# Patient Record
Sex: Female | Born: 1965 | Race: White | Hispanic: No | Marital: Married | Smoking: Never smoker
Health system: Southern US, Community
[De-identification: ages and names within clinical notes are randomized; demographics above are authoritative.]

## PROBLEM LIST (undated history)

## (undated) DIAGNOSIS — E785 Hyperlipidemia, unspecified: Secondary | ICD-10-CM

## (undated) DIAGNOSIS — E119 Type 2 diabetes mellitus without complications: Secondary | ICD-10-CM

## (undated) DIAGNOSIS — C4491 Basal cell carcinoma of skin, unspecified: Secondary | ICD-10-CM

## (undated) HISTORY — PX: CHOLECYSTECTOMY: SHX55

## (undated) HISTORY — DX: Hyperlipidemia, unspecified: E78.5

---
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
## 2000-01-18 ENCOUNTER — Other Ambulatory Visit: Admission: RE | Admit: 2000-01-18 | Discharge: 2000-01-18 | Payer: Self-pay | Admitting: Obstetrics & Gynecology

## 2000-03-11 ENCOUNTER — Ambulatory Visit (HOSPITAL_BASED_OUTPATIENT_CLINIC_OR_DEPARTMENT_OTHER): Admission: RE | Admit: 2000-03-11 | Discharge: 2000-03-11 | Payer: Self-pay | Admitting: General Surgery

## 2000-03-11 ENCOUNTER — Encounter (INDEPENDENT_AMBULATORY_CARE_PROVIDER_SITE_OTHER): Payer: Self-pay | Admitting: Specialist

## 2001-11-16 ENCOUNTER — Other Ambulatory Visit: Admission: RE | Admit: 2001-11-16 | Discharge: 2001-11-16 | Payer: Self-pay | Admitting: Obstetrics & Gynecology

## 2003-05-24 ENCOUNTER — Other Ambulatory Visit: Admission: RE | Admit: 2003-05-24 | Discharge: 2003-05-24 | Payer: Self-pay | Admitting: Obstetrics and Gynecology

## 2004-02-21 ENCOUNTER — Inpatient Hospital Stay (HOSPITAL_COMMUNITY): Admission: EM | Admit: 2004-02-21 | Discharge: 2004-02-25 | Payer: Self-pay | Admitting: Emergency Medicine

## 2004-03-07 ENCOUNTER — Ambulatory Visit (HOSPITAL_COMMUNITY): Admission: RE | Admit: 2004-03-07 | Discharge: 2004-03-07 | Payer: Self-pay | Admitting: Internal Medicine

## 2004-05-25 ENCOUNTER — Encounter (INDEPENDENT_AMBULATORY_CARE_PROVIDER_SITE_OTHER): Payer: Self-pay | Admitting: Specialist

## 2004-05-25 ENCOUNTER — Observation Stay (HOSPITAL_COMMUNITY): Admission: RE | Admit: 2004-05-25 | Discharge: 2004-05-26 | Payer: Self-pay | Admitting: General Surgery

## 2005-06-25 ENCOUNTER — Other Ambulatory Visit: Admission: RE | Admit: 2005-06-25 | Discharge: 2005-06-25 | Payer: Self-pay | Admitting: Obstetrics & Gynecology

## 2005-07-16 ENCOUNTER — Ambulatory Visit (HOSPITAL_COMMUNITY): Admission: RE | Admit: 2005-07-16 | Discharge: 2005-07-16 | Payer: Self-pay | Admitting: Family Medicine

## 2005-11-27 ENCOUNTER — Encounter: Admission: RE | Admit: 2005-11-27 | Discharge: 2005-11-27 | Payer: Self-pay | Admitting: Obstetrics & Gynecology

## 2006-04-29 ENCOUNTER — Encounter: Admission: RE | Admit: 2006-04-29 | Discharge: 2006-04-29 | Payer: Self-pay | Admitting: Obstetrics & Gynecology

## 2007-01-21 ENCOUNTER — Encounter: Admission: RE | Admit: 2007-01-21 | Discharge: 2007-01-21 | Payer: Self-pay | Admitting: Obstetrics & Gynecology

## 2007-04-08 ENCOUNTER — Encounter (INDEPENDENT_AMBULATORY_CARE_PROVIDER_SITE_OTHER): Payer: Self-pay | Admitting: Obstetrics & Gynecology

## 2007-04-08 ENCOUNTER — Ambulatory Visit (HOSPITAL_COMMUNITY): Admission: RE | Admit: 2007-04-08 | Discharge: 2007-04-08 | Payer: Self-pay | Admitting: Obstetrics & Gynecology

## 2008-04-19 ENCOUNTER — Encounter: Admission: RE | Admit: 2008-04-19 | Discharge: 2008-04-19 | Payer: Self-pay | Admitting: Obstetrics & Gynecology

## 2009-09-27 ENCOUNTER — Encounter: Admission: RE | Admit: 2009-09-27 | Discharge: 2009-09-27 | Payer: Self-pay | Admitting: Obstetrics & Gynecology

## 2011-03-05 NOTE — Op Note (Signed)
NAME:  Rachel Bishop, Rachel Bishop NO.:  [id number]   MEDICAL RECORD NO.:  [id number]          PATIENT TYPE:  AMB   LOCATION:  SDC                           FACILITY:  [facility]   PHYSICIAN:  Freddy Finner, M.D.   DATE OF BIRTH:  10/02/1966   DATE OF PROCEDURE:  04/08/2007  DATE OF DISCHARGE:                               OPERATIVE REPORT   PREOPERATIVE DIAGNOSIS:  Menorrhagia.  Ultrasound and clinical findings  consistent with adenomyosis.   POSTOPERATIVE DIAGNOSIS:  Menorrhagia.  Ultrasound and clinical findings  consistent with adenomyosis.   OPERATIVE PROCEDURE:  Hysteroscopy, dilatation and curettage, and  Novasure endometrial ablation.   SURGEON:  Freddy Finner, M.D.   ANESTHESIA:  General.   ESTIMATED INTRAOPERATIVE BLOOD LOSS:  Less than 10 cc.   INTRAOPERATIVE COMPLICATIONS:  None.   Patient is a 45 year old married white female who is known to have  significant menorrhagia.  She has had an evaluation in the office,  including pelvic ultrasound with sonohystogram, showing no intracavitary  abnormalities but somewhat thickened myometrium with findings consistent  with uterine adenomyosis.  The possibility that the Novasure procedure  will fail, and an increased number of patients who have this condition  have been discussed with the patient.  Other options of therapy,  including hysterectomy and an intrauterine device have been discussed  with her as potentially better treatments for the menorrhagia associated  with adenomyosis.  She has comitted and asked for this procedure as a  way of attempting to deal with it, accepting the increased risk of  failure based on the discussion that we had.  She is admitted now for  that purpose.   She was admitted on the morning of surgery.  She was given an antibiotic  pulse.  She was brought to the operating room, placed under general  anesthesia, placed in a dorsal lithotomy position using the Allen's  stirrups system.   Betadine prep was carried out in a standard fashion.  Sterile drapes were applied.  A bivalve speculum was introduced into the  vagina.  Cervix was visualized and grasped on the anterior lip of the  single-tooth tenaculum.  Paracervical block was placed using a total of  10 cc of 1% plain Xylocaine.  Injections were made at 4 and 8 o'clock in  the vaginal fornices at the level of the uterosacral ligaments, to a  depth of approximately 5 mm.  The cervix was then sounded to 2.5 cm.  The uterus sounded to 10 cm.  Maximum cavity length for the procedure  was 6.5, the Novasure device, which was used.  The cervix was then  progressively dilated to 23 with [facility] dilators.  The 12.5 degree  hysteroscope was introduced using lactated Ringer's, and inspection of  the cavity revealed a little thickening of the endometrium on the  posterior surface but no obvious abnormalities.  Thorough gentle  curettage was carried out, and the tissue submitted for examination.  The Novasure device was then applied.  The cavity width was 5.1 cm.  The  ablation time was 52 seconds with 180 watts  of power.  Reinspection  hysteroscopically after the ablation confirmed complete ablation in the  endometrial lining of the uterus.  At this point, the procedure was  terminated.  The instruments were removed.  The patient was awakened and taken to the  recovery room in good condition.  She has Vicodin to be taken as needed  for postoperative pain.  She was given routine outpatient surgical  instructions.  She is to follow up in the office in approximately two  weeks.      Freddy Finner, M.D.  Electronically Signed     WRN/MEDQ  D:  04/08/2007  T:  04/08/2007  Job:  [id number]

## 2011-03-08 NOTE — Op Note (Signed)
NAME:  Rachel Bishop, Rachel Bishop                         ACCOUNT NO.:  [id number]   MEDICAL RECORD NO.:  [id number]                   PATIENT TYPE:  OBV   LOCATION:  0481                                 FACILITY:  [facility]   PHYSICIAN:  Timothy E. Earlene Plater, M.D.              DATE OF BIRTH:  1965-12-17   DATE OF PROCEDURE:  05/25/2004  DATE OF DISCHARGE:                                 OPERATIVE REPORT   PREOPERATIVE DIAGNOSES:  Chronic cholecystolithiasis with pancreatitis.   POSTOPERATIVE DIAGNOSES:  Chronic cholecystolithiasis with pancreatitis.   PROCEDURE:  Laparoscopic cholecystectomy and operative cholangiogram.   SURGEON:  Timothy E. Earlene Plater, M.D.   ASSISTANT:  Gita Kudo, M.D.   ANESTHESIA:  General, Dr. Sandria Manly and CRNA.   [name] is 40.  Over the past 10 years, she has had three episodes of  pancreatitis with hospitalization in [place]. Several ultrasounds have  not demonstrated detectable stones; however, MRCP has demonstrated sludge in  the gallbladder during these episodes. She now elects to proceed with  elective cholecystectomy as has been carefully explained in the office. Her  laboratory data today are normal.   The patient is seen, identified and a permit signed, evaluated by  anesthesia.   The patient taken to the operating room, placed supine, general endotracheal  anesthesia administered. The abdomen was prepped and draped in the usual  fashion. Marcaine 0.5% with epinephrine was used prior to all incisions.  An  infraumbilical incision made, fascia identified, opened in the midline,  peritoneum entered without complications. Hasson catheter placed there and  tied with a #1 Vicryl across the fascia. The abdomen insufflated. A second  10 mm trocar placed in the mid epigastrium, two 5 mm trocars in the right  upper quadrant. General peritoneoscopy was unrevealing. There was a  questionable left inguinal hernia. Attention was turned to the gallbladder,  it was  tense, fatty covered but no adhesions.  It was grasped, placed on  tension and careful dissection at the base of the gallbladder revealed an  anterior cystic artery which was identified, dissected free, triply clipped  and divided. The cystic duct behind that was then dissected out, a complete  window made, no other structures noted. A clip placed on the gallbladder  side of the cystic duct, the cystic duct opened and a percutaneously placed  cholangiogram catheter was inserted into the stump of the cystic duct. A  clip placed and real-time fluoroscopy was used to demonstrate a normal  biliary tree with free rapid flow into the duodenum. This was confirmed by  Dr. Ronney Asters. I removed the clip and the catheter, the cystic duct fully  divided. Then careful dissection with tension released the gallbladder from  the gallbladder bed, although there appeared to be intense inflammation on  the posterior wall.  No complications ensued. The gallbladder bed was dry  and irrigation was clear. The gallbladder was removed through the  infraumbilical incision which was then closed and tied under direct vision.  The gallbladder was palpated, did not feel any large stones. It was  submitted to pathology.  Meanwhile irrigation was carried out and remained  clear.  All irrigant, CO2, instruments and trocars removed under direct  vision.  Each wound checked, cautery used when  needed and each wound again injected with 0.5% Marcaine with epinephrine and  the wound was closed with 4-0 Monocryl.  Steri-Strips applied, dry sterile  dressing, final counts correct. She tolerated it well and was extubated and  taken to the recovery room in good condition.                                               Timothy E. Earlene Plater, M.D.    TED/MEDQ  D:  05/25/2004  T:  05/26/2004  Job:  [id number]   cc:   Madelin Rear. Sherwood Gambler, M.D.  [address]  [place]  [place] [address]  Fax: [phone]   W. Varney Baas, M.D.  [address] [place]  [place] [address]  Fax: [phone]

## 2011-03-08 NOTE — Op Note (Signed)
[facility]  Patient:    Rachel Bishop, Rachel Bishop                      MRN: [id number] Proc. Date: 03/11/00 Adm. Date:  [id number] Disc. Date: [id number] Attending:  Carson Myrtle                           Operative Report  PREOPERATIVE DIAGNOSIS:  Mass, left breast.  POSTOPERATIVE DIAGNOSIS:  Mass, left breast.  PROCEDURE:  Excisional biopsy of mass of left breast.  SURGEON:  Timothy E. Earlene Plater, M.D.  ASSISTANT:  ANESTHESIA:  [place].  INDICATIONS:  Ms. Axelrod is 47.  She has had a breast mass for some years which has not changed much, but she is more concerned about it.  She felt as though it was perhaps groin.  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  The patient was seen and evaluated.  I agreed to proceed with a biopsy as she wished.  DESCRIPTION OF PROCEDURE:  The patient was brought to the operating room and placed supine.  IV started and sedation given.  She was then placed in the semiupright position with the left breast exposed.  The left breast was prepped and draped in the usual fashion.  The mass was easily palpable between the areolar edge and the axillary tail about midway.  The area was infiltrated with 0.25% Marcaine with epinephrine.  A curvilinear incision was made over the mass.  The subcutaneous tissues were dissected.  The mass was identified, grasped, placed on tension, and sharply dissected from the surrounding normal-looking breast tissue.  This is a suspicious mass.  I did cut it. Because of its suspiciousness, I reapproximated it with a suture and left that suture in place.  Bleeding was carefully controlled with the cautery and the wound was dry.  It was closed in layers with 3-0 Vicryl and 4-0 Monocryl. Steri-Strips were applied.  Counts were correct.  She tolerated it well and was removed to the recovery room in good condition.  Written and verbal instructions were given to her and her husband.  She will  be followed as an outpatient. DD:  03/11/00 TD:  03/16/00 Job: [id number]

## 2011-03-08 NOTE — Consult Note (Signed)
NAME:  Rachel Bishop, Rachel Bishop                        ACCOUNT NO.:  [id number]   MEDICAL RECORD NO.:  [id number]                   PATIENT TYPE:  INP   LOCATION:  A320                                 FACILITY:  [facility]   PHYSICIAN:  Barbaraann Barthel, M.D.              DATE OF BIRTH:  1966-01-29   DATE OF CONSULTATION:  02/24/2004  DATE OF DISCHARGE:                                   CONSULTATION   SURGICAL CONSULTATION.   NOTE:  Surgery was asked to see this 45 year old white female admitted to  the medical service for pancreatitis.  Chief complaint is that of epigastric  discomfort. Workup to date has shown that she has no evidence of stones and  the MRCP does not show any kind of biliary tree abnormality and she has no  history of alcohol abuse and her lipid profile appears within normal limits.   There was a question on sonography of some sludge which suggests to [place] the  possibility that maybe her pancreatic episodes have been caused by possibly  microcrystals that have caused pancreatitis and elective cholecystectomy was  considered and surgery was consulted. This is the third episode of  pancreatitis in this patient.   HISTORY OF PRESENT MEDICAL ILLNESS:  The patient states that last Tuesday  after eating a grilled sandwich she developed some severe epigastric pain  that was belt-like in nature and accompanied by bloating; and later by  severe pain, nausea and vomiting.  She was admitted and workup has shown the  above findings by MRCP and sonogram.  Her liver function studies are within  normal limits; however, her amylase and lipase were elevated and now  currently are in a downward trend with the previously measured amylase at  280 and the current one at 88, and the previous lipase at 156 with the  current one at 46.  Her bilirubin is also within normal limits.  Her H&H is  11.8 with 34.6 with an 87.9 neutrophils.   PHYSICAL EXAMINATION:  GENERAL:  Physical examination discloses a  pleasant,  45 year old white female who is uncomfortable but in no acute distress.  VITAL SIGNS:  Her temperature is 99.2.  Her heart rate was 117.  Her blood  pressure 130/68, respirations 20.  She is 5 feet 9 inches and weighs 236  pounds.  HEENT:  Head is normocephalic.  Eyes extraocular movements are intact.  Pupils are round and react to light and accommodation.  There is no  conjunctival pallor or scleral injection.  Sclerae are of normal tincture.  Nasal and oral mucosa is moist.  NECK:  Neck is supple and cylindrical without jugular vein distention,  thyromegaly or tracheal deviation.  No bruits are auscultated and there is  no thyromegaly.  CHEST:  Clear to both anterior and posterior auscultation.  HEART:  Regular rhythm.  BREASTS:  There is a previous biopsy on the left breast.  Breasts otherwise  are without masses and axillae are negative.  ABDOMEN:  Bowel sounds are present.  The patient is tender between the  umbilicus and the xiphoid process in the epigastrium, but a lot less tender  than before.  She has less back pain than before.  No femoral or inguinal  hernias are appreciated.  RECTAL:  Examination was previously performed and not repeated. Guaiac-  positive stools were found at that time.  EXTREMITIES:  Within normal limits.   PAST SURGERIES:  Past surgeries have included a tubal ligation and a left  breast biopsy.   MEDICATIONS:  The patient takes no medication on a regular basis.   ALLERGIES:  No known allergies.   LABORATORY DATA:  As mentioned above, she does have a potassium of 3.1.   REVIEW OF SYSTEMS:  GI SYSTEM:  A third episode of pancreatitis.  No past  history of hepatitis.  No history of gallstones by sonogram on previous  workups.  Lipid profile appears to be within normal limits and no history of  alcohol abuse.  No history of bright red rectal bleeding or black tarry  stools.  The patient is noted to have some guaiac-positive stools.  No   history of unexplained weight loss.  GENITOURINARY SYSTEM:  No history of  nephrolithiasis or dysuria.  ENDOCRINE SYSTEM:  No history of diabetes or  thyroid disease.  CARDIORESPIRATORY SYSTEM:  The patient is a nondrinker,  nonsmoker, no history of chest pain or any other cardiac or respiratory  symptoms.  MUSCULOSKELETAL SYSTEM:  The patient is obese.  OB/GYN SYSTEM:  The patient is a gravida 3, para 3, abortus 0, cesarean 0 female whose serum  hCG is negative and who has no family history of breast cancer and had a  biopsy of a benign left breast lesion.   IMPRESSION:  1. Pancreatitis, possibly secondary to microcrystals and sludge or     idiopathic.  2. Guaiac-positive stools.  3. Hypokalemia.   PLAN:  I agree with the GI service that microcalcifications may the cause of  this with the sludge noted in the sonogram and we will plan for elective  cholecystectomy with the patient at a later date when her pancreatitis  subsides. I discussed this in detail with the patient and her family.  She  will also receive a colonoscopy to further workup the guaiac-positive stools  at a later date when her pancreatitis subsides.  The hypokalemia will be replaced and we will follow her electrolytes and  watch her fluid balance and obtain a CT scan should the patient develop any  changes suggesting that there may be a necrotic process developing within  her pancreas which clinically is not at all apparent as her pain has vastly  improved as well as her amylase and lipase are showing a downward trend.  She does not also have any signs of any sepsis.  We will follow her and, as  stated, plan for elective procedure when her pancreatitis is quiescent.      ___________________________________________                                            Barbaraann Barthel, M.D.   WB/MEDQ  D:  02/24/2004  T:  02/24/2004  Job:  [id number]  cc:   Barbaraann Barthel, M.D.  [address]  [place]  [place] [address]  Fax:  [phone]   Madelin Rear. Sherwood Gambler, M.D.  [address]  [place]  [place] [address]  Fax: [phone]   R. Roetta Sessions, M.D.  [address]  [place]  [place] [address]  Fax: [phone]

## 2011-03-08 NOTE — Consult Note (Signed)
NAME:  Rachel Bishop, Rachel Bishop                        ACCOUNT NO.:  [id number]   MEDICAL RECORD NO.:  [id number]                   PATIENT TYPE:  INP   LOCATION:  A320                                 FACILITY:  [facility]   PHYSICIAN:  R. Roetta Sessions, M.D.              DATE OF BIRTH:  Apr 08, 1966   DATE OF CONSULTATION:  DATE OF DISCHARGE:                                   CONSULTATION   HISTORY:  The patient is a 45 year old Caucasian female admitted Feb 21, 2004, for severe epigastric pain and right upper quadrant pain with  vomiting.  The patient had a similar episode in 2000 and was diagnosed as  having pancreatitis.  The patient has had no recurrent episodes until this  sudden onset of pain, Feb 21, 2004.  The patient denies fever, chills,  fatigue, loss of appetite or weight changes prior to this episode.  The  patient reports no chest pain, no history of heart problems, no  palpitations. The patient denies shortness of breath or difficulty  breathing.  The patient denies any vomiting, reflux, hematemesis or  swallowing difficulties.  The patient denies diarrhea, although her last  bowel movement which was Feb 21, 2004, was somewhat loose.  The patient  denies constipation stating she has a daily formed bowel movement.  She  denies hematochezia or melena.   The patient states that the epigastric pain is 10/10.  The patient has never  had an EGD or colonoscopy.   CURRENT MEDICATIONS:  The patient states that she has taken one to two Advil  and ibuprofen in the past two weeks, and does not regularly use any  nonsteroidals including Goody Powders or [place].  The patient takes no  herbal medicines, vitamins or mineral supplements.   MEDICATIONS IN HOSPITAL:  1. Xanax 25 mg q.6h. p.r.n.  2. Guaifenesin 10 milliliter q.i.d. p.r.n.  3. Dilaudid 2 mg IV p.r.n.  4. Zofran 4 mg IV q.6h. p.r.n.  5. Phenergan 25 mg p.o. q.6h. p.r.n.  6. Senokot one tablet p.o. q.h.s.  7. Ambien 10 mg p.o.  q.h.s. p.r.n.   PAST MEDICAL HISTORY:  Significant for one episode of biliary colic  diagnosed as pancreatitis in 2000.   SURGICAL HISTORY:  Benign left breast mass, biopsied in 2000.   ALLERGIES:  No known drug allergies.   FAMILY HISTORY:  The patient's mother is alive and in good health.  The  father is alive and in good health.  The patient has one sister in good  health and one half sister in good health.  The patient reports a family  history of diabetes in both of her grandmothers.  There is no family history  of colon cancer or any other types of cancer.   SOCIAL HISTORY:  The patient is married.  She lives with her husband.  She  has three healthy children.  She works as a Interior and spatial designer.  The patient  denies  past or present use of tobacco, alcohol or street drugs.   PHYSICAL EXAMINATION:  VITAL SIGNS:  Temperature 97.8, pulse 120,  respirations 20, blood pressure 122/75.  Height is 5 feet, 9 inches.  Weight  236 pounds.  HEENT:  Normocephalic, atraumatic.  Sclerae are clear and nonicteric.  Conjunctivae are pink.  NECK:  Supple.  There are no masses or thyromegaly.  No lymphadenopathy.  CARDIOVASCULAR:  Regular rate and rhythm, no murmurs, rubs or gallops.  The  rate is 120 and tachycardic.  RESPIRATORY:  Clear to auscultation bilaterally.  No wheezes, rales or  rhonchi.  SKIN:  Warm, dry and without jaundice.  ABDOMEN:  Soft, obese, slightly distended.  There is epigastric and left  upper quadrant tenderness to palpation with guarding.  There is no  hepatosplenomegaly or masses palpated.  Bowel sounds are present but  diminished.  RECTAL:  Examination is unremarkable.  Hemoccult positive.  EXTREMITIES:  There is no edema, clubbing or cyanosis.   LABORATORY DATA:  Feb 22, 2004.  White count 11.5, hemoglobin 12.6,  hematocrit 36.7.  MCV 90.  Platelets 286,000.  Sodium 138, potassium 4.1,  chloride 105.  The cO2 is 26, glucose 107.  BUN 10.  Creatinine 0.7,  alkaline  phosphatase 66, SGOT 16, PT 18.  Total protein 7.2, albumin 3.8,  calcium 9.6, amylase 833, lipase 607, down from 5450 on Feb 21, 2004.  UA is  positive for protein.   DIAGNOSTIC IMAGING:  A gallbladder ultrasound done on May 3, showed the  common bile duct to be within normal limits, and there is no evidence of  gallstones.   IMPRESSION:  8. A 45 year old Caucasian female with severe epigastric and right upper     quadrant pain with vomiting.  The patient was asymptomatic prior to this     time.  The patient reports a history of a similar incident approximately     five years ago which was diagnosed as pancreatitis.  The patient denies     any episodes since that time.  2. Ultrasound of the gallbladder within normal limits, possible     choledocholithiasis or small cholelithiasis.  3. Markedly elevated lipase greater than 5000.  Possibly pancreatitis     secondary to  elevated triglycerides.  We need to do a fasting lipid panel.  Suggest an MR  CT for recurrent pancreatitis and to rule out gallstones as a separate  issue.  1. The hemoccult positive stools need to be addressed with a colonoscopy as     an outpatient.   RECOMMENDATIONS:  1. Magnetic resonance cholangiopancreatography today for recurrent     pancreatitis, to rule out gallstones.  2. Fasting lipid panel to assess triglyceride levels.  The patient will be     held N.P.O. and the patient needs to schedule outpatient colonoscopy to     address guaiac positive stools.   Thank you for this referral.     ________________________________________  ___________________________________________  Ashok Pall, PA                           Jonathon Bellows, M.D.   GC/MEDQ  D:  02/22/2004  T:  02/22/2004  Job:  [id number]

## 2011-03-08 NOTE — Discharge Summary (Signed)
NAME:  Rachel Bishop, Rachel Bishop                        ACCOUNT NO.:  [id number]   MEDICAL RECORD NO.:  [id number]                   PATIENT TYPE:  INP   LOCATION:  A320                                 FACILITY:  [facility]   PHYSICIAN:  Madelin Rear. Sherwood Gambler, M.D.             DATE OF BIRTH:  Jul 24, 1966   DATE OF ADMISSION:  02/21/2004  DATE OF DISCHARGE:  02/25/2004                                 DISCHARGE SUMMARY   DISCHARGE DIAGNOSES:  1. Acute pancreatitis.  2. Biliary sludge.  3. Possible cholecystitis.   SUMMARY:  The patient was admitted with abdominal pain, vomiting and found  to have severe elevations of pancreatic enzymes and a clinical exam  consistent with pancreatitis.  She was evaluated by GI.  An MRCP was  performed as well as gallbladder and upper abdominal ultrasound.  The common  duct was within normal limits.  There was possible biliary sludge.  She was  also incidentally noted to have hemoccult positive stools.  She is pending  colonoscopy to evaluate that as an outpatient.   HOSPITAL COURSE:  The patient's course in the hospital was unremarkable.  She was noted to be mildly hypokalemic, presumably secondary to vomiting.  She was replaced.  Her enzymes were serially assessed as were examinations,  and she gradually improved on bowel rest.   DISCHARGE MEDICATIONS:  1. Xanax p.r.n.  2. Dilaudid p.r.n.  3. Zofran p.r.n.  4. Senokot daily.  5. Ambien p.r.n.   PLAN:  She is pending arrangement for outpatient colonoscopy with Dr. Jonathon Bellows, as well as possible biliary manometry at [facility].     ___________________________________________                                         Madelin Rear. Sherwood Gambler, M.D.   LJF/MEDQ  D:  03/04/2004  T:  03/04/2004  Job:  [id number]

## 2011-03-08 NOTE — H&P (Signed)
NAME:  Rachel Bishop, Rachel Bishop                        ACCOUNT NO.:  [id number]   MEDICAL RECORD NO.:  [id number]                   PATIENT TYPE:  EMS   LOCATION:  ED                                   FACILITY:  [facility]   PHYSICIAN:  Madelin Rear. Sherwood Gambler, M.D.             DATE OF BIRTH:  22-Jan-1966   DATE OF ADMISSION:  02/21/2004  DATE OF DISCHARGE:                                HISTORY & PHYSICAL   CHIEF COMPLAINT:  Abdominal pain.   HISTORY OF PRESENT ILLNESS:  Patient at approximately 1:00 this afternoon  developed severe epigastric and slight right upper quadrant abdominal pain  followed by vomiting.  She denies any hematemesis, hematochezia, melena,  fever, rigors, or chills.  There has been no diarrhea.   PAST MEDICAL HISTORY:  She apparently may have had some biliary colic in the  past, although she did not have biliary surgery.  Past medical history is  negative.   PAST SURGICAL HISTORY:  Negative.   SOCIAL HISTORY:  No alcohol, tobacco, or drug use.   FAMILY HISTORY:  Noncontributory.   REVIEW OF SYSTEMS:  Under HPI, all others negative.   PHYSICAL EXAMINATION:  GENERAL:  She is acutely distressed secondary to  pain.  HEAD/NECK:  No JVD or adenopathy.  Neck is supple.  LUNGS:  Clear.  HEART:  Regular rate and rhythm without murmur, rub or gallop.  ABDOMEN:  Positive bowel sounds.  Positive epigastric tenderness with  positive guarding.  There is no organomegaly or masses.  EXTREMITIES:  Without clubbing, cyanosis or edema.  NEUROLOGIC:  Nonfocal.   Laboratory studies were remarkable for a lipase reported to me as greater  than 5000.   A gallbladder ultrasound was obtained as well as upper abdominal ultrasound,  again, verbally reported to me by radiology as having a normal common bile  duct and negative for gallbladder stones.   IMPRESSION:  Abdominal pain secondary to acute pancreatitis:  Still most  likely statistically to be gallstone-induced pancreatitis:  We will  follow  serial enzymes, bowel rest, parenteral analgesia, and fluids.  Possibly will  need an MRCP to further delineate possible common duct obstruction.  GI  consultation will also be sought.     ___________________________________________                                         Madelin Rear. Sherwood Gambler, M.D.   LJF/MEDQ  D:  02/21/2004  T:  02/21/2004  Job:  [id number]

## 2011-03-08 NOTE — Op Note (Signed)
NAME:  Rachel Bishop, Rachel Bishop                        ACCOUNT NO.:  [id number]   MEDICAL RECORD NO.:  [id number]                   PATIENT TYPE:  AMB   LOCATION:  DAY                                  FACILITY:  [facility]   PHYSICIAN:  R. Roetta Sessions, M.D.              DATE OF BIRTH:  [birth year]   DATE OF PROCEDURE:  DATE OF DISCHARGE:                                 OPERATIVE REPORT   PROCEDURE:  Diagnostic colonoscopy.   ENDOSCOPIST:  Gerrit Friends. Rourk, M.D.   INDICATIONS FOR PROCEDURE:  The patient is a 45 year old lady recently  hospitalized with pancreatitis which was felt to be biliary in origin.  She  was found to be Hemoccult positive.  She comes for colonoscopy prior to  elective cholecystectomy.  This approach has been discussed with the patient  previously and again today at the bedside.  The potential risks, benefits,  and alternatives have been reviewed; questions answered.  She is agreeable.  Please see my handwritten H&P for more information.   PROCEDURE NOTE:  O2 saturation, blood pressure, pulse and respirations were  monitored throughout the entire procedure.  Conscious sedation: Fentanyl 100 mcg IV, Versed 5 mg IV in divided doses.   INSTRUMENT:  Olympus video chip system.   FINDINGS:  Digital rectal exam revealed no abnormalities.   ENDOSCOPIC FINDINGS:  The prep was good.   RECTUM:  Examination of the rectal mucosa revealed some anal canal  hemorrhoids, otherwise rectal mucosa appeared normal. It is notable the  rectal vault was quite narrow and I was unable to retroflex, but I feel that  the rectal mucosa was seen well.   COLON:  The colonic mucosa was surveyed from the rectosigmoid junction  through the left transverse and right colon to the area of the appendiceal  orifice, ileocecal valve, and cecum.   From this level the scope was slowly withdrawn.  All previously mentioned  mucosal surfaces were again seen.  The patient had a few scattered, shallow,  left-sided diverticula.  The remainder of the colonic mucosa appeared  normal.  The patient tolerated the procedure well and was reacted in  endoscopy.   IMPRESSION:  1. Anal canal hemorrhoids, likely the source of Hemoccult-positive stool;     otherwise normal rectum.  2. A few left-sided diverticula the remainder of the colonic mucosa appeared     normal.   RECOMMENDATIONS:  1. Begin standard colorectal cancer screening via colonoscopy at age 80.  2. Follow up with Dr. Malvin Johns in regards to elective cholecystectomy.      ___________________________________________                                            Jonathon Bellows, M.D.   RMR/MEDQ  D:  03/07/2004  T:  03/07/2004  Job:  [id number]  cc:   Barbaraann Barthel, M.D.  [address]  [place]  [place] [address]  Fax: [phone]   Patrica Duel, M.D.  [address], [address]  [place]  [place] [address]  Fax: [phone]   R. Roetta Sessions, M.D.  [address]  [place]  [place] [address]  Fax: [phone]

## 2011-08-07 LAB — CBC
Hemoglobin: 13.3
MCHC: 33.8
RDW: 13.2

## 2013-08-10 ENCOUNTER — Other Ambulatory Visit (HOSPITAL_COMMUNITY): Payer: Self-pay | Admitting: Family Medicine

## 2013-08-10 ENCOUNTER — Ambulatory Visit (HOSPITAL_COMMUNITY)
Admission: RE | Admit: 2013-08-10 | Discharge: 2013-08-10 | Disposition: A | Discharge status: Home / Self Care | Payer: PRIVATE HEALTH INSURANCE | Source: Ambulatory Visit | Attending: Family Medicine | Admitting: Family Medicine

## 2013-08-10 DIAGNOSIS — M25551 Pain in right hip: Secondary | ICD-10-CM

## 2013-08-10 DIAGNOSIS — M25559 Pain in unspecified hip: Secondary | ICD-10-CM | POA: Insufficient documentation

## 2017-05-06 ENCOUNTER — Other Ambulatory Visit: Payer: Self-pay | Admitting: Dermatology

## 2017-05-06 DIAGNOSIS — C4491 Basal cell carcinoma of skin, unspecified: Secondary | ICD-10-CM

## 2017-05-06 HISTORY — DX: Basal cell carcinoma of skin, unspecified: C44.91

## 2020-04-19 ENCOUNTER — Other Ambulatory Visit (HOSPITAL_COMMUNITY): Payer: Self-pay | Admitting: Family Medicine

## 2020-04-19 ENCOUNTER — Other Ambulatory Visit: Payer: Self-pay | Admitting: Family Medicine

## 2020-04-19 DIAGNOSIS — R519 Headache, unspecified: Secondary | ICD-10-CM

## 2020-05-22 ENCOUNTER — Ambulatory Visit (HOSPITAL_COMMUNITY)
Admission: RE | Admit: 2020-05-22 | Discharge: 2020-05-22 | Disposition: A | Discharge status: Home / Self Care | Payer: PRIVATE HEALTH INSURANCE | Source: Ambulatory Visit | Attending: Family Medicine | Admitting: Family Medicine

## 2020-05-22 DIAGNOSIS — R519 Headache, unspecified: Secondary | ICD-10-CM | POA: Diagnosis present

## 2020-05-22 MED ORDER — GADOBUTROL 1 MMOL/ML IV SOLN
10.0000 mL | Freq: Once | INTRAVENOUS | Status: AC | PRN
  Administered 2020-05-22: 10 mL via INTRAVENOUS

## 2020-08-29 ENCOUNTER — Encounter: Payer: Self-pay | Admitting: Dermatology

## 2020-08-29 ENCOUNTER — Ambulatory Visit (INDEPENDENT_AMBULATORY_CARE_PROVIDER_SITE_OTHER): Payer: PRIVATE HEALTH INSURANCE | Admitting: Dermatology

## 2020-08-29 ENCOUNTER — Other Ambulatory Visit: Payer: Self-pay

## 2020-08-29 ENCOUNTER — Telehealth: Payer: Self-pay | Admitting: Dermatology

## 2020-08-29 DIAGNOSIS — C44712 Basal cell carcinoma of skin of right lower limb, including hip: Secondary | ICD-10-CM | POA: Diagnosis not present

## 2020-08-29 DIAGNOSIS — D485 Neoplasm of uncertain behavior of skin: Secondary | ICD-10-CM

## 2020-08-29 DIAGNOSIS — L509 Urticaria, unspecified: Secondary | ICD-10-CM | POA: Diagnosis not present

## 2020-08-29 DIAGNOSIS — Z1283 Encounter for screening for malignant neoplasm of skin: Secondary | ICD-10-CM

## 2020-08-29 NOTE — Telephone Encounter (Signed)
Patient is calling to say that she is having a hard time finding the Claritin with the blue top...she can only find a white top.  Only wants to speak with nurse to find out what she should do.

## 2020-08-29 NOTE — Telephone Encounter (Signed)
Phone call to patient to info her that she can get the Claritin/Loratadine with the white top if that's the only one she's able to find.  Patient aware.

## 2020-08-29 NOTE — Patient Instructions (Addendum)
Biopsy, Surgery (Curettage) & Surgery (Excision) Aftercare Instructions  1. Okay to remove bandage in 24 hours  2. Wash area with soap and water  3. Apply Vaseline to area twice daily until healed (Not Neosporin)  4. Okay to cover with a Band-Aid to decrease the chance of infection or prevent irritation from clothing; also it's okay to uncover lesion at home.  5. Suture instructions: return to our office in 7-10 or 10-14 days for a nurse visit for suture removal. Variable healing with sutures, if pain or itching occurs call our office. It's okay to shower or bathe 24 hours after sutures are given.  6. The following risks may occur after a biopsy, curettage or excision: bleeding, scarring, discoloration, recurrence, infection (redness, yellow drainage, pain or swelling).  7. For questions, concerns and results call our office at [facility] before 4pm & Friday before 3pm. Biopsy results will be available in 1 week.  Several issues discussed today with Rachel Bishop; Date of birth 1966-05-23.  first we did a general skin check for skin growths.  There are no atypical moles or skin cancer on the back. the purple spot under the right breast is a benign hemangioma.  The new pink waxy spot on the right lower calf is a possible nonmole skin cancer and shave biopsy done.  Rachel Bishop understands that this will be slow to heal because of the location and when possible she will get her right leg elevated.  Secondly she has been plagued with hives for 3 weeks.  Previous episodes of hives coincided with major events in her life and this 1 followed her daughter's wedding.  Examination showed typical scattered urticarial wheals.  She will take over-the-counter generic Claritin (loratadine) 10 mg 1 pill in the morning and 1 pill before supper.  She may continue to take 25 mg oral Benadryl at night.  She has previously received multiple cortisone shots for this but I tried to explain why this is a strategy I prefer to  avoid.  Follow-up for the hives will be by telephone next week.  Marland Kitchen

## 2020-09-01 ENCOUNTER — Telehealth: Payer: Self-pay | Admitting: *Deleted

## 2020-09-01 NOTE — Telephone Encounter (Signed)
Path to patient. Made surgery appointment with Dr.Tafeen.  

## 2020-09-01 NOTE — Telephone Encounter (Signed)
-----   Message from Lavonna Monarch, MD sent at 09/01/2020  7:12 AM EST ----- Schedule surgery with Dr. Darene Lamer

## 2020-09-12 ENCOUNTER — Encounter: Payer: Self-pay | Admitting: Dermatology

## 2020-09-12 NOTE — Progress Notes (Signed)
   Follow-Up Visit   Subjective  Rachel Bishop is a 54 y.o. female who presents for the following: Skin Problem (right calf x 6 months- wont go away).  Growth right leg Location:  Duration:  Quality:  Associated Signs/Symptoms: Modifying Factors:  Severity:  Timing: Context: Wants to discuss 3-week history of hives; several previous episodes.  Objective  Well appearing patient in no apparent distress; mood and affect are within normal limits.  A focused examination was performed including , Neck, back, chest, arms, legs.. Relevant physical exam findings are noted in the Assessment and Plan.   Assessment & Plan    Screening exam for skin cancer Chest - Medial [facility])  Encourage self examination of skin twice annually.  Neoplasm of uncertain behavior of skin Right Lower Leg - Posterior  Skin / nail biopsy Type of biopsy: tangential   Informed consent: discussed and consent obtained   Timeout: patient name, date of birth, surgical site, and procedure verified   Procedure prep:  Patient was prepped and draped in usual sterile fashion (Non sterile) Prep type:  Chlorhexidine Anesthesia: the lesion was anesthetized in a standard fashion   Anesthetic:  1% lidocaine w/ epinephrine 1-100,000 local infiltration Instrument used: flexible razor blade   Outcome: patient tolerated procedure well   Post-procedure details: wound care instructions given    Specimen 1 - Surgical pathology Differential Diagnosis: BCC SCC Check Margins: No  Hives Chest - Medial ([facility])  Twice daily 10 mg loratadine plus at bedtime 25 mg diphenhydramine for the next week; follow-up by phone at that time.  Several issues discussed today with Rachel Bishop; Date of birth 1965/12/11.  first we did a general skin check for skin growths.  There are no atypical moles or skin cancer on the back. the purple spot under the right breast is a benign hemangioma.  The new pink waxy spot on the right lower calf  is a possible nonmole skin cancer and shave biopsy done.  Yuleimy understands that this will be slow to heal because of the location and when possible she will get her right leg elevated.  Secondly she has been plagued with hives for 3 weeks.  Previous episodes of hives coincided with major events in her life and this 1 followed her daughter's wedding.  Examination showed typical scattered urticarial wheals.  She will take over-the-counter generic Claritin (loratadine) 10 mg 1 pill in the morning and 1 pill before supper.  She may continue to take 25 mg oral Benadryl at night.  She has previously received multiple cortisone shots for this but I tried to explain why this is a strategy I prefer to avoid.  Follow-up for the hives will be by telephone next week.  Robb Matar, MD, have reviewed all documentation for this visit.  The documentation on 09/12/20 for the exam, diagnosis, procedures, and orders are all accurate and complete.

## 2020-10-26 ENCOUNTER — Encounter: Payer: PRIVATE HEALTH INSURANCE | Admitting: Dermatology

## 2020-11-03 ENCOUNTER — Encounter: Payer: Self-pay | Admitting: Dermatology

## 2020-11-03 ENCOUNTER — Other Ambulatory Visit: Payer: Self-pay

## 2020-11-03 ENCOUNTER — Ambulatory Visit (INDEPENDENT_AMBULATORY_CARE_PROVIDER_SITE_OTHER): Payer: PRIVATE HEALTH INSURANCE | Admitting: Dermatology

## 2020-11-03 DIAGNOSIS — C44712 Basal cell carcinoma of skin of right lower limb, including hip: Secondary | ICD-10-CM

## 2020-11-03 DIAGNOSIS — C4491 Basal cell carcinoma of skin, unspecified: Secondary | ICD-10-CM

## 2020-11-03 MED ORDER — MUPIROCIN 2 % EX OINT: 1.0000 "application " | TOPICAL_OINTMENT | Freq: Two times a day (BID) | CUTANEOUS | 0 refills | Status: DC

## 2020-11-03 NOTE — Patient Instructions (Signed)

## 2020-11-07 ENCOUNTER — Encounter: Payer: Self-pay | Admitting: Dermatology

## 2020-11-07 NOTE — Progress Notes (Signed)
   Follow-Up Visit   Subjective  Rachel Bishop is a 55 y.o. female who presents for the following: Procedure (Here for treatment- right lower leg- posterior- bcc x 1).  BCC Location: Right outer calf Duration:  Quality:  Associated Signs/Symptoms: Modifying Factors:  Severity:  Timing: Context: For treatment  Objective  Well appearing patient in no apparent distress; mood and affect are within normal limits. Objective  Right Lower Leg - Posterior: Biopsy site identified by patient, nurse, and me.   A focused examination was performed including Legs.. Relevant physical exam findings are noted in the Assessment and Plan.   Assessment & Plan    Basal cell carcinoma (BCC), unspecified site Right Lower Leg - Posterior  Destruction of lesion Complexity: simple   Destruction method: electrodesiccation and curettage   Informed consent: discussed and consent obtained   Timeout:  patient name, date of birth, surgical site, and procedure verified Anesthesia: the lesion was anesthetized in a standard fashion   Anesthetic:  1% lidocaine w/ epinephrine 1-100,000 local infiltration Curettage performed in three different directions: Yes   Curettage cycles:  3 Lesion length (cm):  1.7 Lesion width (cm):  1.7 Margin per side (cm):  0 Final wound size (cm):  1.7 Hemostasis achieved with:  ferric subsulfate Outcome: patient tolerated procedure well with no complications   Additional details:  Wound innoculated with 5 fluorouracil solution.  Although the microscopic call this a superficial BCC, curettage disclosed focal dermal involvement.  After triple curettage the base and margins were cauterized followed by repeat curette and inoculation with parenteral 5-fluorouracil.     I, Lavonna Monarch, MD, have reviewed all documentation for this visit.  The documentation on 11/07/20 for the exam, diagnosis, procedures, and orders are all accurate and complete.

## 2021-01-31 ENCOUNTER — Encounter: Payer: Self-pay | Admitting: Dermatology

## 2021-01-31 ENCOUNTER — Other Ambulatory Visit: Payer: Self-pay

## 2021-01-31 ENCOUNTER — Ambulatory Visit (INDEPENDENT_AMBULATORY_CARE_PROVIDER_SITE_OTHER): Payer: PRIVATE HEALTH INSURANCE | Admitting: Dermatology

## 2021-01-31 DIAGNOSIS — Z1283 Encounter for screening for malignant neoplasm of skin: Secondary | ICD-10-CM

## 2021-01-31 DIAGNOSIS — Z85828 Personal history of other malignant neoplasm of skin: Secondary | ICD-10-CM | POA: Diagnosis not present

## 2021-01-31 DIAGNOSIS — L851 Acquired keratosis [keratoderma] palmaris et plantaris: Secondary | ICD-10-CM

## 2021-01-31 DIAGNOSIS — L821 Other seborrheic keratosis: Secondary | ICD-10-CM

## 2021-01-31 DIAGNOSIS — D1801 Hemangioma of skin and subcutaneous tissue: Secondary | ICD-10-CM | POA: Diagnosis not present

## 2021-02-10 ENCOUNTER — Encounter: Payer: Self-pay | Admitting: Dermatology

## 2021-02-18 NOTE — Progress Notes (Signed)
   Follow-Up Visit   Subjective  Rachel Bishop is a 55 y.o. female who presents for the following: Follow-up (Here for follow up right lower leg- posterior- healing good & no concerns - also want lesion rechecked on left thigh- x months- no itch no bleed).  Follow-up BCC right leg plus several other areas to check Location:  Duration:  Quality:  Associated Signs/Symptoms: Modifying Factors:  Severity:  Timing: Context:   Objective  Well appearing patient in no apparent distress; mood and affect are within normal limits. Objective  Right Temporal Scalp: 4 mm smooth violet papule type: Typical dermoscopy  Objective  Left Thigh - Anterior: Four millimeter tan textured flattopped papule  Objective  Right Lower Leg - Posterior: No sign recurrence  Objective  Left Lower Leg - Anterior, Right Lower Leg - Anterior: Flat tan subtly textured 5 mm  Objective  Neck - Anterior: All sun exposed areas plus upper chest and back examined.  No atypical pigmented lesions or new nonmelanoma skin cancer.    All skin waist up examined.  Plus legs.  Areas beneath undergarments not fully examined.   Assessment & Plan    Hemangioma of skin Right Temporal Scalp  Intervention not currently necessary  Seborrheic keratosis Left Thigh - Anterior  Leave if stable  History of basal cell carcinoma (BCC) Right Lower Leg - Posterior  Recheck as needed change  Stucco keratosis (2) Left Lower Leg - Anterior; Right Lower Leg - Anterior  No intervention necessary  Encounter for screening for malignant neoplasm of skin Neck - Anterior  Annual skin examination, encouraged to self examine twice annually.  Continued ultraviolet protection.      I, Lavonna Monarch, MD, have reviewed all documentation for this visit.  The documentation on 02/18/21 for the exam, diagnosis, procedures, and orders are all accurate and complete.

## 2021-12-28 ENCOUNTER — Emergency Department (HOSPITAL_COMMUNITY)
Admission: EM | Admit: 2021-12-28 | Discharge: 2021-12-28 | Disposition: A | Discharge status: Home / Self Care | Payer: PRIVATE HEALTH INSURANCE | Attending: Emergency Medicine | Admitting: Emergency Medicine

## 2021-12-28 ENCOUNTER — Encounter (HOSPITAL_COMMUNITY): Payer: Self-pay

## 2021-12-28 ENCOUNTER — Emergency Department (HOSPITAL_COMMUNITY): Payer: PRIVATE HEALTH INSURANCE

## 2021-12-28 ENCOUNTER — Other Ambulatory Visit: Payer: Self-pay

## 2021-12-28 DIAGNOSIS — R932 Abnormal findings on diagnostic imaging of liver and biliary tract: Secondary | ICD-10-CM | POA: Diagnosis not present

## 2021-12-28 DIAGNOSIS — N200 Calculus of kidney: Secondary | ICD-10-CM | POA: Insufficient documentation

## 2021-12-28 DIAGNOSIS — Z85828 Personal history of other malignant neoplasm of skin: Secondary | ICD-10-CM | POA: Diagnosis not present

## 2021-12-28 DIAGNOSIS — R300 Dysuria: Secondary | ICD-10-CM | POA: Diagnosis present

## 2021-12-28 LAB — URINALYSIS, ROUTINE W REFLEX MICROSCOPIC
Bacteria, UA: NONE SEEN
Bilirubin Urine: NEGATIVE
Glucose, UA: NEGATIVE mg/dL
Ketones, ur: NEGATIVE mg/dL
Nitrite: NEGATIVE
Protein, ur: NEGATIVE mg/dL
Specific Gravity, Urine: 1.01 (ref 1.005–1.030)
pH: 5 (ref 5.0–8.0)

## 2021-12-28 LAB — COMPREHENSIVE METABOLIC PANEL
ALT: 17 U/L (ref 0–44)
AST: 15 U/L (ref 15–41)
Albumin: 4 g/dL (ref 3.5–5.0)
Alkaline Phosphatase: 64 U/L (ref 38–126)
Anion gap: 10 (ref 5–15)
BUN: 24 mg/dL — ABNORMAL HIGH (ref 6–20)
CO2: 21 mmol/L — ABNORMAL LOW (ref 22–32)
Calcium: 9.4 mg/dL (ref 8.9–10.3)
Chloride: 104 mmol/L (ref 98–111)
Creatinine, Ser: 0.63 mg/dL (ref 0.44–1.00)
GFR, Estimated: >60 mL/min (ref >60)
Glucose, Bld: 224 mg/dL — ABNORMAL HIGH (ref 70–99)
Potassium: 4.1 mmol/L (ref 3.5–5.1)
Sodium: 135 mmol/L (ref 135–145)
Total Bilirubin: 0.4 mg/dL (ref 0.3–1.2)
Total Protein: 7.5 g/dL (ref 6.5–8.1)

## 2021-12-28 LAB — CBC
HCT: 43.6 % (ref 36.0–46.0)
Hemoglobin: 14.5 g/dL (ref 12.0–15.0)
MCH: 31.1 pg (ref 26.0–34.0)
MCHC: 33.3 g/dL (ref 30.0–36.0)
MCV: 93.6 fL (ref 80.0–100.0)
Platelets: 250 10*3/uL (ref 150–400)
RBC: 4.66 MIL/uL (ref 3.87–5.11)
RDW: 11.9 % (ref 11.5–15.5)
WBC: 7 10*3/uL (ref 4.0–10.5)
nRBC: 0 % (ref 0.0–0.2)

## 2021-12-28 LAB — LIPASE, BLOOD: Lipase: 36 U/L (ref 11–51)

## 2021-12-28 MED ORDER — HYDROMORPHONE HCL 1 MG/ML IJ SOLN
0.5000 mg | Freq: Once | INTRAMUSCULAR | Status: AC
  Administered 2021-12-28: 0.5 mg via INTRAVENOUS
  Filled 2021-12-28: qty 1

## 2021-12-28 MED ORDER — KETOROLAC TROMETHAMINE 15 MG/ML IJ SOLN
15.0000 mg | Freq: Once | INTRAMUSCULAR | Status: AC
  Administered 2021-12-28: 15 mg via INTRAVENOUS
  Filled 2021-12-28: qty 1

## 2021-12-28 MED ORDER — TAMSULOSIN HCL 0.4 MG PO CAPS: 0.4000 mg | ORAL_CAPSULE | Freq: Every day | ORAL | 0 refills | Status: DC

## 2021-12-28 MED ORDER — SODIUM CHLORIDE 0.9 % IV BOLUS
1000.0000 mL | Freq: Once | INTRAVENOUS | Status: AC
  Administered 2021-12-28: 1000 mL via INTRAVENOUS

## 2021-12-28 MED ORDER — OXYCODONE HCL 5 MG PO TABS: 5.0000 mg | ORAL_TABLET | ORAL | 0 refills | Status: DC | PRN

## 2021-12-28 NOTE — ED Provider Notes (Signed)
?[place] DEPT ?[facility] Emergency Department ?Provider Note ?MRN:  [id number]  ?Arrival date & time: 12/28/21    ? ?Chief Complaint   ?Flank Pain ?  ?History of Present Illness   ?Rachel Bishop is a 56 y.o. year-old female with no pertinent past medical history presenting to the ED with chief complaint of flank pain. ? ?Right flank pain, sudden onset a couple hours ago.  Some burning with urination.  No other complaints ? ?Review of Systems  ?A thorough review of systems was obtained and all systems are negative except as noted in the HPI and PMH.  ? ?Patient's Health History   ? ?Past Medical History:  ?Diagnosis Date  ? BCC (basal cell carcinoma of skin) 05/06/2017  ? bcc sup right lower shin tx cx3 60f  ?  ?Past Surgical History:  ?Procedure Laterality Date  ? CHOLECYSTECTOMY    ?  ?History reviewed. No pertinent family history.  ?Social History  ? ?Socioeconomic History  ? Marital status: Married  ?  Spouse name: Not on file  ? Number of children: Not on file  ? Years of education: Not on file  ? Highest education level: Not on file  ?Occupational History  ? Not on file  ?Tobacco Use  ? Smoking status: Never  ? Smokeless tobacco: Never  ?Vaping Use  ? Vaping Use: Not on file  ?Substance and Sexual Activity  ? Alcohol use: Never  ? Drug use: Never  ? Sexual activity: Not on file  ?Other Topics Concern  ? Not on file  ?Social History Narrative  ? Not on file  ? ?Social Determinants of Health  ? ?Financial Resource Strain: Not on file  ?Food Insecurity: Not on file  ?Transportation Needs: Not on file  ?Physical Activity: Not on file  ?Stress: Not on file  ?Social Connections: Not on file  ?Intimate Partner Violence: Not on file  ?  ? ?Physical Exam  ? ?Vitals:  ? 12/28/21 0400 12/28/21 0430  ?BP: 118/72 124/81  ?Pulse: 76 86  ?Resp: 18 18  ?Temp:    ?SpO2: 94% 98%  ?  ?CONSTITUTIONAL: Well-appearing, in mild distress due to pain ?NEURO/PSYCH:  Alert and oriented x 3, no focal deficits ?EYES:  eyes  equal and reactive ?ENT/NECK:  no LAD, no JVD ?CARDIO: Regular rate, well-perfused, normal S1 and S2 ?PULM:  CTAB no wheezing or rhonchi ?GI/GU:  non-distended, non-tender ?MSK/SPINE:  No gross deformities, no edema ?SKIN:  no rash, atraumatic ? ? ?*Additional and/or pertinent findings included in MDM below ? ?Diagnostic and Interventional Summary  ? ? EKG Interpretation ? ?Date/Time:    ?Ventricular Rate:    ?PR Interval:    ?QRS Duration:   ?QT Interval:    ?QTC Calculation:   ?R Axis:     ?Text Interpretation:   ?  ? ?  ? ?Labs Reviewed  ?COMPREHENSIVE METABOLIC PANEL - Abnormal; Notable for the following components:  ?    Result Value  ? CO2 21 (*)   ? Glucose, Bld 224 (*)   ? BUN 24 (*)   ? All other components within normal limits  ?URINALYSIS, ROUTINE W REFLEX MICROSCOPIC - Abnormal; Notable for the following components:  ? Color, Urine STRAW (*)   ? Hgb urine dipstick LARGE (*)   ? Leukocytes,Ua SMALL (*)   ? All other components within normal limits  ?CBC  ?LIPASE, BLOOD  ?  ?CT RENAL STONE STUDY  ?Final Result  ?  ?  ?Medications  ?  sodium chloride 0.9 % bolus 1,000 mL (0 mLs Intravenous Stopped 12/28/21 0451)  ?ketorolac (TORADOL) 15 MG/ML injection 15 mg (15 mg Intravenous Given 12/28/21 0400)  ?HYDROmorphone (DILAUDID) injection 0.5 mg (0.5 mg Intravenous Given 12/28/21 0400)  ?  ? ?Procedures  /  Critical Care ?Procedures ? ?ED Course and Medical Decision Making  ?Initial Impression and Ddx ?Suspect kidney stone, also considering right-sided diverticulitis, appendicitis, pyelonephritis.  Awaiting labs, CT, urinalysis. ? ?Past medical/surgical history that increases complexity of ED encounter: None ? ?Interpretation of Diagnostics ?I personally reviewed the laboratory assessment and my interpretation is as follows: No significant blood count or electrolyte disturbance, urinalysis without evidence of infection ?   ?CT revealing 1 mm stone, also with liver abnormality ? ?Patient Reassessment and Ultimate  Disposition/Management ?Patient is feeling a lot better, she is made aware of the liver abnormality and the kidney stone.  Appropriate for discharge.  Will refer to urology. ? ?Patient management required discussion with the following services or consulting groups:  None ? ?Complexity of Problems Addressed ?Acute illness or injury that poses threat of life of bodily function ? ?Additional Data Reviewed and Analyzed ?Further history obtained from: ?Further history from spouse/family member ? ?Additional Factors Impacting ED Encounter Risk ?Prescriptions and Consideration of hospitalization ? ?Barth Kirks. Sedonia Small, MD ?[facility] ?[place] [email or web address] ? ?Final Clinical Impressions(s) / ED Diagnoses  ? ?  ICD-10-CM   ?1. Kidney stone  N20.0   ?  ?2. Abnormal finding on imaging of liver  R93.2   ?  ?  ?ED Discharge Orders   ? ?      Ordered  ?  oxyCODONE (ROXICODONE) 5 MG immediate release tablet  Every 4 hours PRN       ? 12/28/21 0529  ?  tamsulosin (FLOMAX) 0.4 MG CAPS capsule  Daily       ? 12/28/21 0529  ? ?  ?  ? ?  ?  ? ?Discharge Instructions Discussed with and Provided to Patient:  ? ? ? ?Discharge Instructions   ? ?  ?You were evaluated in the Emergency Department and after careful evaluation, we did not find any emergent condition requiring admission or further testing in the hospital. ? ?Your exam/testing today is overall reassuring.  Symptoms seem to be due to a kidney stone.  You should be able to pass the kidney stone on your own at home.  Use the Flomax daily to help pass it.  Recommend Tylenol 1000 mg every 4-6 hours and/or Motrin 600 mg every 4-6 hours for pain.  You can use the oxycodone medication for more significant pain. ? ?We found a small abnormality on your liver on the CT scan.  We are recommending an outpatient MRI.  Please talk to your primary care doctor to schedule this. ? ?Please return to the Emergency Department if you experience any worsening  of your condition.   Thank you for allowing [place] to be a part of your care. ? ? ? ? ?  ?Maudie Flakes, MD ?12/28/21 [phone] ? ?

## 2021-12-28 NOTE — ED Triage Notes (Signed)
Pt presents with RLQ pain that is sharp and continuous. Pt states it feels like it is going to explode. Started at [employer]. Denies N/V but endorses some burning while urinating. ?

## 2021-12-28 NOTE — ED Notes (Signed)
Went to discharge patient and pt states that her pain is back and is a 10. EDP made aware and stated can stay for more meds or be discharged. Pt wants to stay and awaiting more orders.  ?

## 2021-12-28 NOTE — Discharge Instructions (Signed)
You were evaluated in the Emergency Department and after careful evaluation, we did not find any emergent condition requiring admission or further testing in the hospital. ? ?Your exam/testing today is overall reassuring.  Symptoms seem to be due to a kidney stone.  You should be able to pass the kidney stone on your own at home.  Use the Flomax daily to help pass it.  Recommend Tylenol 1000 mg every 4-6 hours and/or Motrin 600 mg every 4-6 hours for pain.  You can use the oxycodone medication for more significant pain. ? ?We found a small abnormality on your liver on the CT scan.  We are recommending an outpatient MRI.  Please talk to your primary care doctor to schedule this. ? ?Please return to the Emergency Department if you experience any worsening of your condition.   Thank you for allowing [place] to be a part of your care. ?

## 2021-12-28 NOTE — ED Notes (Signed)
Patient transported to CT 

## 2021-12-28 NOTE — ED Notes (Signed)
Patient Alert and oriented to baseline. Stable and ambulatory to baseline. Patient verbalized understanding of the discharge instructions.  Patient belongings were taken by the patient.   

## 2022-01-02 ENCOUNTER — Other Ambulatory Visit: Payer: Self-pay | Admitting: Family Medicine

## 2022-01-02 ENCOUNTER — Other Ambulatory Visit (HOSPITAL_COMMUNITY): Payer: Self-pay | Admitting: Family Medicine

## 2022-01-02 DIAGNOSIS — K769 Liver disease, unspecified: Secondary | ICD-10-CM

## 2022-01-18 ENCOUNTER — Encounter (HOSPITAL_COMMUNITY): Payer: Self-pay

## 2022-01-18 ENCOUNTER — Ambulatory Visit (HOSPITAL_COMMUNITY): Payer: PRIVATE HEALTH INSURANCE

## 2022-01-18 ENCOUNTER — Ambulatory Visit (HOSPITAL_COMMUNITY)
Admission: RE | Admit: 2022-01-18 | Discharge: 2022-01-18 | Disposition: A | Discharge status: Home / Self Care | Payer: PRIVATE HEALTH INSURANCE | Source: Ambulatory Visit | Attending: Family Medicine | Admitting: Family Medicine

## 2022-01-18 DIAGNOSIS — K769 Liver disease, unspecified: Secondary | ICD-10-CM | POA: Insufficient documentation

## 2022-01-18 MED ORDER — GADOBUTROL 1 MMOL/ML IV SOLN
10.0000 mL | Freq: Once | INTRAVENOUS | Status: AC | PRN
  Administered 2022-01-18: 10 mL via INTRAVENOUS

## 2022-02-06 ENCOUNTER — Encounter: Payer: Self-pay | Admitting: Dermatology

## 2022-02-06 ENCOUNTER — Ambulatory Visit (INDEPENDENT_AMBULATORY_CARE_PROVIDER_SITE_OTHER): Payer: PRIVATE HEALTH INSURANCE | Admitting: Dermatology

## 2022-02-06 DIAGNOSIS — D1800 Hemangioma unspecified site: Secondary | ICD-10-CM | POA: Diagnosis not present

## 2022-02-06 DIAGNOSIS — Z1283 Encounter for screening for malignant neoplasm of skin: Secondary | ICD-10-CM | POA: Diagnosis not present

## 2022-02-06 DIAGNOSIS — L918 Other hypertrophic disorders of the skin: Secondary | ICD-10-CM | POA: Diagnosis not present

## 2022-02-06 DIAGNOSIS — Z85828 Personal history of other malignant neoplasm of skin: Secondary | ICD-10-CM

## 2022-02-06 DIAGNOSIS — L299 Pruritus, unspecified: Secondary | ICD-10-CM | POA: Diagnosis not present

## 2022-02-23 ENCOUNTER — Encounter: Payer: Self-pay | Admitting: Dermatology

## 2022-02-23 NOTE — Progress Notes (Signed)
? ?  Follow-Up Visit ?  ?Subjective  ?Rachel Bishop is a 56 y.o. female who presents for the following: Annual Exam (No new concerns). ? ?General skin check, history of basal cell carcinoma leg ?Location:  ?Duration:  ?Quality:  ?Associated Signs/Symptoms: ?Modifying Factors:  ?Severity:  ?Timing: ?Context:  ? ?Objective  ?Well appearing patient in no apparent distress; mood and affect are within normal limits. ?Full body skin examination: No atypical pigmented lesions (all checked with dermoscopy), no new nonmelanoma skin cancer ? ?1 to 2 mm smooth red dermal papules ? ?Right Inframammary Fold ?Fleshy, skin-colored pedunculated papule.   ? ?Left Concha, Right Concha ?Itching with minimal objective inflammation/scale distal ear canals.  No sign of seborrheic dermatitis currently on scalp and face. ? ?Right Lower Leg - Posterior ?No sign residual skin cancer, dyschromic scar ? ? ? ?A full examination was performed including scalp, head, eyes, ears, nose, lips, neck, chest, axillae, abdomen, back, buttocks, bilateral upper extremities, bilateral lower extremities, hands, feet, fingers, toes, fingernails, and toenails. All findings within normal limits unless otherwise noted below.  Areas beneath undergarments not fully examined ? ? ?Assessment & Plan  ? ? ?Encounter for screening for malignant neoplasm of skin ? ?Annual skin examination, encouraged patient to self examine twice annually.  Continue ultraviolet protection. ? ?Hemangioma, unspecified site ? ?No intervention necessary ? ?Skin tag ?Right Inframammary Fold ? ?She may choose to move in future ? ?Itch (2) ?Left Concha; Right Concha ? ?Otc hydrocortisone cream daily for 3 weeks; call if there is no improvement ? ?Personal history of skin cancer ?Right Lower Leg - Posterior ? ?Recheck if there is clinical change ? ? ? ? ? ?I, Lavonna Monarch, MD, have reviewed all documentation for this visit.  The documentation on 02/23/22 for the exam, diagnosis, procedures,  and orders are all accurate and complete. ?

## 2022-07-01 IMAGING — MR MR ABDOMEN WO/W CM
20 series · 48 of 48 positions shown · IV contrast (10 ml Gadavist)
Comparison: CT abdomen pelvis, 12/28/2021

CLINICAL DATA: Characterize incidental liver lesion identified by
prior CT

EXAM:
MRI ABDOMEN WITHOUT AND WITH CONTRAST
TECHNIQUE: Multiplanar multisequence MR imaging of the abdomen was performed
both before and after the administration of intravenous contrast.
CONTRAST:  10mL GADAVIST GADOBUTROL 1 MMOL/ML IV SOLN

[Series 3: cor haste · coronal · 6.0mm · 1.25mm/px · 2 of 26 slices shown]
[im 1/26]
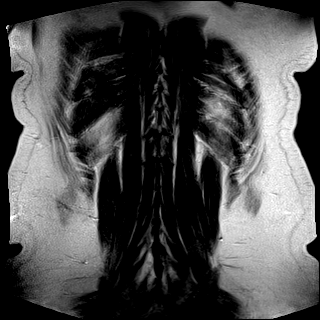
[im 26/26]
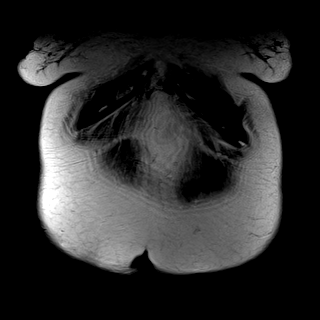

[Series 6: T2 fat-sat · axial · 6.0mm · 1.19mm/px · z∈[-3,+242]mm · 2 of 35 slices shown]
[im 1/35]
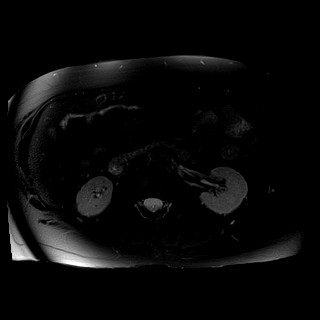
[im 35/35]
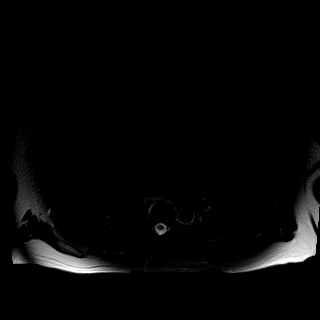

[Series 7: DWI · axial · 6.0mm · 1.42mm/px · z∈[+15,+224]mm · 2 of 30 slices shown (1 of 4)]
[im 1/30]
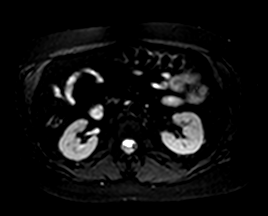
[im 30/30]
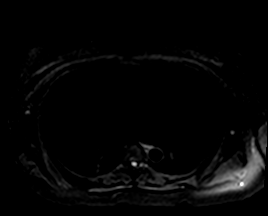

[Series 7: DWI · axial · 6.0mm · 1.42mm/px · 1 of 30 slices shown (2 of 4)]
[im 1/30]
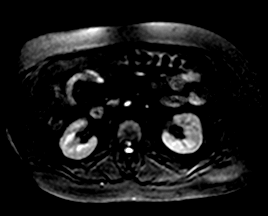

[Series 7: DWI · axial · 6.0mm · 1.42mm/px · 1 of 30 slices shown (3 of 4)]
[im 1/30]
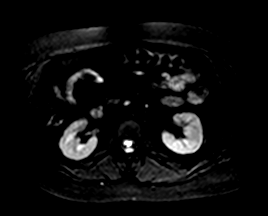

[Series 8: DWI · axial · 6.0mm · 1.42mm/px · 1 of 30 slices shown (4 of 4)]
[im 1/30]
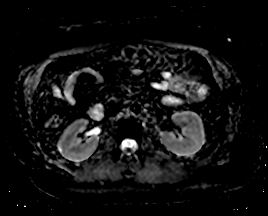

[Series 9: ax in and · axial · 3.5mm · 1.19mm/px · z∈[-70,+235]mm · 3 of 88 slices shown (1 of 2)]
[im 1/88]
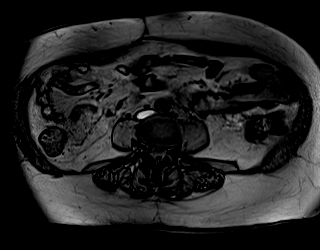
[im 44/88]
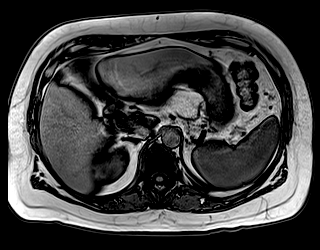
[im 88/88]
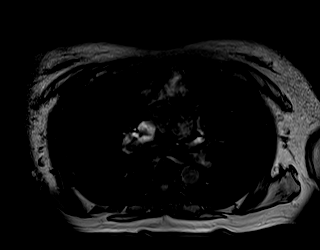

[Series 10: ax in and · axial · 3.5mm · 1.19mm/px · z∈[-70,+235]mm · 3 of 88 slices shown (2 of 2)]
[im 1/88]
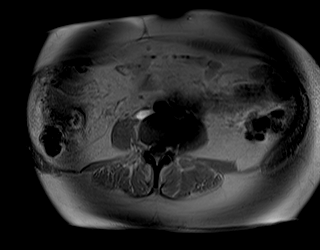
[im 44/88]
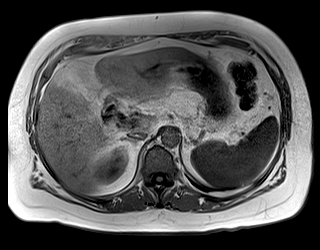
[im 88/88]
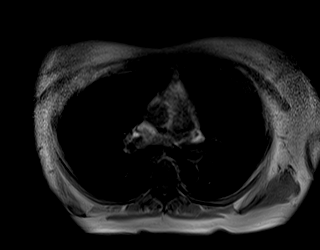

[Series 11: bSSFP · axial · 6.0mm · 0.74mm/px · z∈[-71,+235]mm · 2 of 52 slices shown]
[im 1/52]
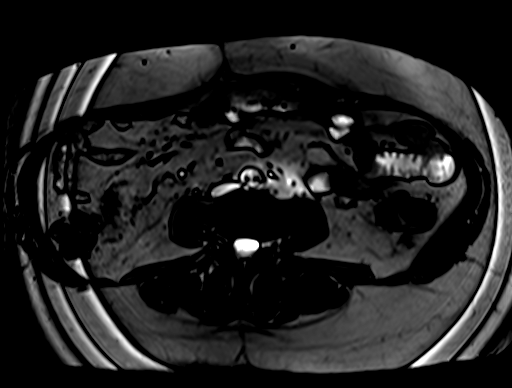
[im 52/52]
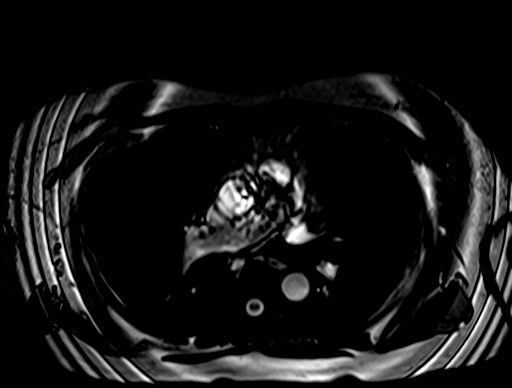

[Series 13: ax haste bh · axial · 6.0mm · 1.19mm/px · 1 of 40 slices shown]
[im 1/40]
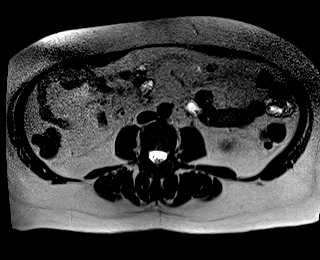

[Series 14: T1 dynamic · axial · 3.0mm · 1.19mm/px · z∈[-24,+237]mm · 3 of 88 slices shown (1 of 9)]
[im 1/88]
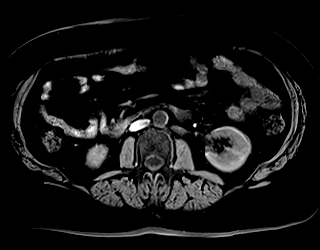
[im 44/88]
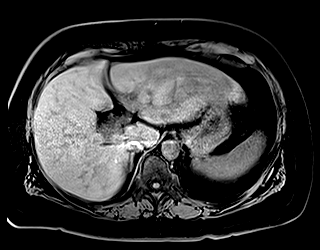
[im 88/88]
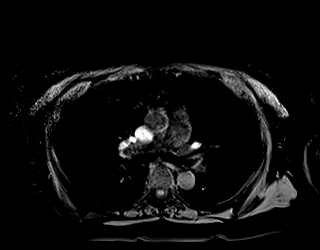

[Series 16: T1 dynamic · axial · 3.0mm · 1.19mm/px · z∈[-24,+237]mm · 3 of 88 slices shown (2 of 9)]
[im 1/88]
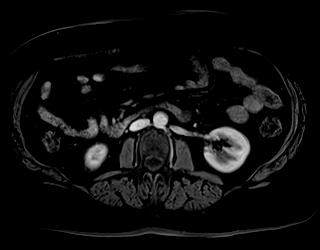
[im 44/88]
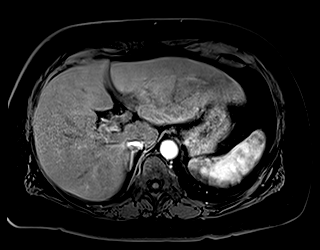
[im 88/88]
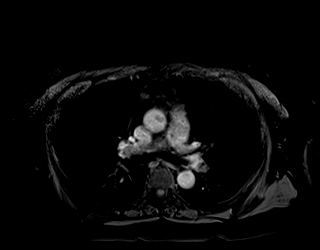

[Series 17: T1 dynamic · axial · 3.0mm · 1.19mm/px · z∈[-24,+237]mm · 3 of 88 slices shown (3 of 9)]
[im 1/88]
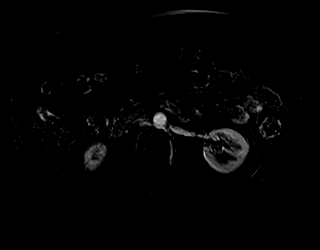
[im 44/88]
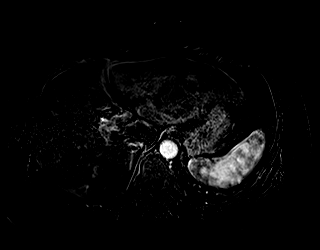
[im 88/88]
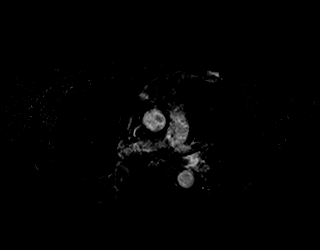

[Series 18: T1 dynamic · axial · 3.0mm · 1.19mm/px · z∈[-24,+237]mm · 3 of 88 slices shown (4 of 9)]
[im 1/88]
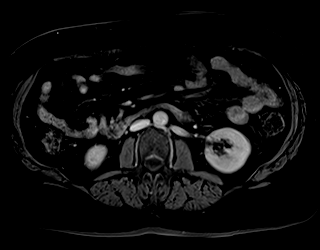
[im 44/88]
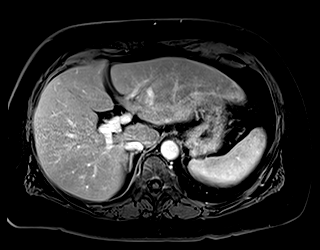
[im 88/88]
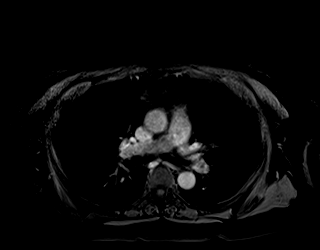

[Series 19: T1 dynamic · axial · 3.0mm · 1.19mm/px · z∈[-24,+237]mm · 3 of 88 slices shown (5 of 9)]
[im 1/88]
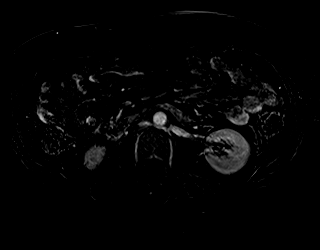
[im 44/88]
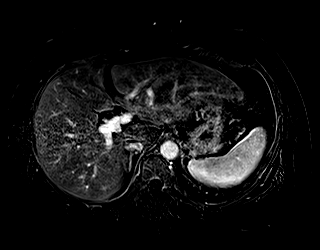
[im 88/88]
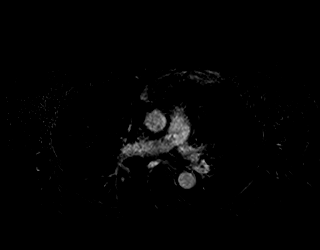

[Series 20: T1 dynamic · axial · 3.0mm · 1.19mm/px · z∈[-24,+237]mm · 3 of 88 slices shown (6 of 9)]
[im 1/88]
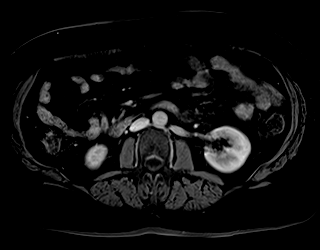
[im 44/88]
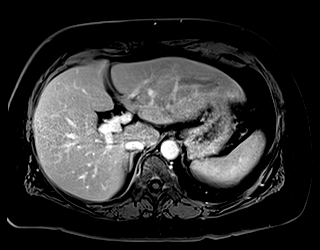
[im 88/88]
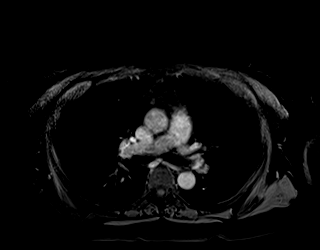

[Series 21: T1 dynamic · axial · 3.0mm · 1.19mm/px · z∈[-24,+237]mm · 3 of 88 slices shown (7 of 9)]
[im 1/88]
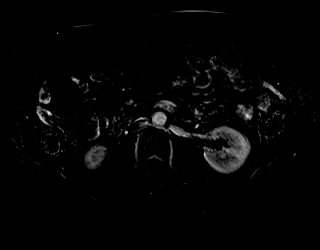
[im 44/88]
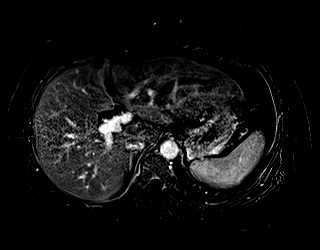
[im 88/88]
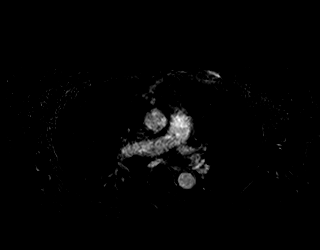

[Series 22: T1 dynamic · axial · 3.0mm · 1.19mm/px · z∈[-24,+237]mm · 3 of 88 slices shown (8 of 9)]
[im 1/88]
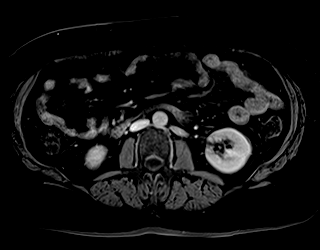
[im 44/88]
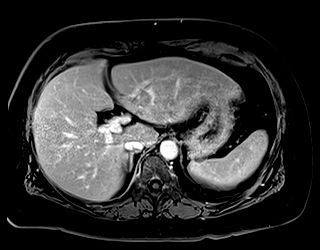
[im 88/88]
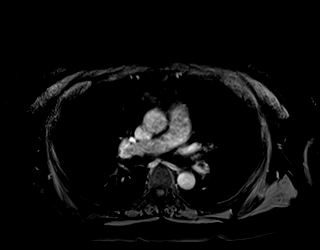

[Series 23: T1 dynamic · axial · 3.0mm · 1.19mm/px · z∈[-24,+237]mm · 3 of 88 slices shown (9 of 9)]
[im 1/88]
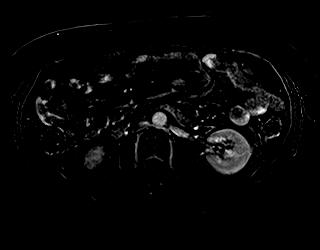
[im 44/88]
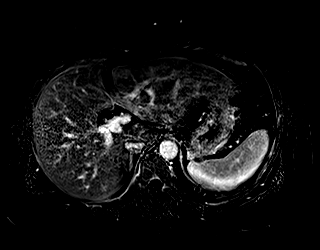
[im 88/88]
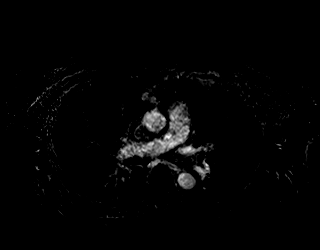

[Series 25: T1 dynamic post-contrast · coronal · 3.0mm · 1.31mm/px · 3 of 72 slices shown]
[im 1/72]
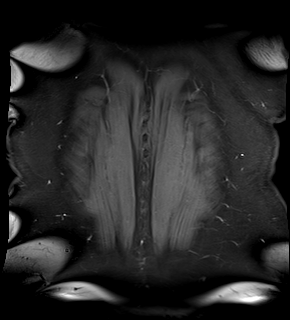
[im 36/72]
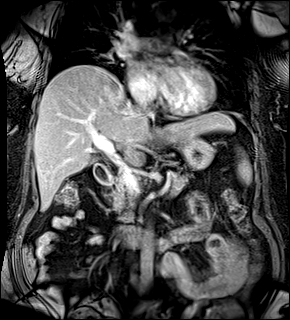
[im 72/72]
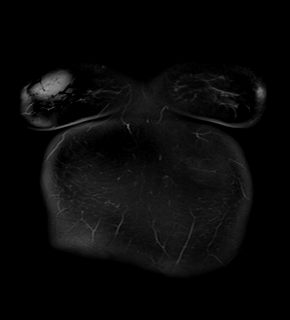

[48 of 48 positions shown; findings below may reference images not displayed]

FINDINGS: Lower chest: No acute findings. Eventration of the right
hemidiaphragm.

Hepatobiliary: No mass or other parenchymal abnormality identified.
Status post cholecystectomy. No biliary ductal dilatation.

Pancreas: No mass, inflammatory changes, or other parenchymal
abnormality identified.No pancreatic ductal dilatation.

Spleen:  Within normal limits in size and appearance.

Adrenals/Urinary Tract: Normal adrenal glands. No renal masses or
suspicious contrast enhancement identified. No evidence of
hydronephrosis.

Stomach/Bowel: Visualized portions within the abdomen are
unremarkable.

Vascular/Lymphatic: No pathologically enlarged lymph nodes
identified. No abdominal aortic aneurysm demonstrated.

Other:  None.

Musculoskeletal: No suspicious osseous lesions identified.
IMPRESSION: 1. No hepatic mass or other significant abnormality is identified.
Specifically, no correlate to queried lesion in the high central
liver dome by prior CT.
2. Status post cholecystectomy.

## 2022-10-03 ENCOUNTER — Ambulatory Visit
Admission: EM | Admit: 2022-10-03 | Discharge: 2022-10-03 | Disposition: A | Discharge status: Home / Self Care | Payer: PRIVATE HEALTH INSURANCE

## 2022-10-03 DIAGNOSIS — J0100 Acute maxillary sinusitis, unspecified: Secondary | ICD-10-CM | POA: Diagnosis not present

## 2022-10-03 MED ORDER — FLUTICASONE PROPIONATE 50 MCG/ACT NA SUSP: 2.0000 | Freq: Every day | NASAL | 0 refills | Status: DC

## 2022-10-03 MED ORDER — AMOXICILLIN-POT CLAVULANATE 875-125 MG PO TABS: 1.0000 | ORAL_TABLET | Freq: Two times a day (BID) | ORAL | 0 refills | Status: DC

## 2022-10-03 MED ORDER — DEXAMETHASONE SODIUM PHOSPHATE 10 MG/ML IJ SOLN
10.0000 mg | INTRAMUSCULAR | Status: AC
  Administered 2022-10-03: 10 mg via INTRAMUSCULAR

## 2022-10-03 NOTE — ED Triage Notes (Signed)
Pt reports right sided facial, sinus pressure, facial swelling and dental pain since 1300 today. Pt taking ciprofloxacin for kidney stone.

## 2022-10-03 NOTE — Discharge Instructions (Addendum)
Take medication as prescribed. Once you complete the Cipro, recommend continued symptomatic treatment for your current symptoms.  If symptoms suddenly worsen, or fail to improve, you can start the antibiotic, Augmentin, that was prescribed. Recommend normal saline nasal spray to help with nasal congestion or runny nose. May take over-the-counter Tylenol or ibuprofen as needed for pain, fever, or general discomfort. Also recommend using a cool cough to the right side of the face to help with pain and swelling. If symptoms fail to improve, recommend following up with your primary care physician for further evaluation. Follow-up as needed.

## 2022-10-03 NOTE — ED Provider Notes (Signed)
[facility]    CSN: [id number] Arrival date & time: 10/03/22  1713      History   Chief Complaint Chief Complaint  Patient presents with   Nasal Congestion   Facial Swelling    HPI Rachel Bishop is a 56 y.o. female.   The history is provided by the patient.   Patient presents for complaints of right-sided facial pressure, facial pain, right ear pain, and tooth pain that started this morning.  Patient states that she has not had fever, chills, runny nose, cough, sore throat, or GI symptoms.  Patient states that she has an extreme amount of pressure to the right side of her face and to the right ear.  She states that she is currently taking Cipro for a suspected kidney stone.  Patient has taken naproxen for her symptoms. Past Medical History:  Diagnosis Date   BCC (basal cell carcinoma of skin) 05/06/2017   bcc sup right lower shin tx cx3 25f    There are no problems to display for this patient.   Past Surgical History:  Procedure Laterality Date   CHOLECYSTECTOMY      OB History   No obstetric history on file.      Home Medications    Prior to Admission medications   Medication Sig Start Date End Date Taking? Authorizing Provider  amoxicillin-clavulanate (AUGMENTIN) 875-125 MG tablet Take 1 tablet by mouth every 12 (twelve) hours. 10/03/22  Yes Firman Petrow-Warren, CAlda Lea NP  fluticasone (FLONASE) 50 MCG/ACT nasal spray Place 2 sprays into both nostrils daily. 10/03/22  Yes Codie Hainer-Warren, CAlda Lea NP  CIPRO 500 MG tablet Take 1 tablet every 12 hours by oral route as directed.    [provider]  Multiple Vitamin (MULTIVITAMIN WITH MINERALS) TABS tablet Take 1 tablet by mouth daily.    [provider]  mupirocin ointment (BACTROBAN) 2 % Apply 1 application topically 2 (two) times daily. 11/03/20   TLavonna Monarch MD  Omega-3 Fatty Acids (FISH OIL) 1000 MG CAPS Take by mouth.    [provider]  oxyCODONE (ROXICODONE) 5 MG  immediate release tablet Take 1 tablet (5 mg total) by mouth every 4 (four) hours as needed for severe pain. 12/28/21   BMaudie Flakes MD  tamsulosin (FLOMAX) 0.4 MG CAPS capsule Take 1 capsule (0.4 mg total) by mouth daily. 12/28/21   BMaudie Flakes MD    Family History History reviewed. No pertinent family history.  Social History Social History   Tobacco Use   Smoking status: Never   Smokeless tobacco: Never  Substance Use Topics   Alcohol use: Never   Drug use: Never     Allergies   Patient has no known allergies.   Review of Systems Review of Systems Per HPI  Physical Exam Triage Vital Signs ED Triage Vitals  Enc Vitals Group     BP 10/03/22 1805 136/83     Pulse Rate 10/03/22 1805 87     Resp 10/03/22 1805 16     Temp 10/03/22 1805 98 F (36.7 C)     Temp Source 10/03/22 1805 Oral     SpO2 10/03/22 1805 97 %     Weight --      Height --      Head Circumference --      Peak Flow --      Pain Score 10/03/22 1806 7     Pain Loc --      Pain Edu? --  Excl. in GC? --    No data found.  Updated Vital Signs BP 136/83 (BP Location: Right Arm)   Pulse 87   Temp 98 F (36.7 C) (Oral)   Resp 16   SpO2 97%   Visual Acuity Right Eye Distance:   Left Eye Distance:   Bilateral Distance:    Right Eye Near:   Left Eye Near:    Bilateral Near:     Physical Exam Vitals and nursing note reviewed.  Constitutional:      General: She is not in acute distress.    Appearance: Normal appearance.  HENT:     Head: Normocephalic.     Right Ear: Tympanic membrane, ear canal and external ear normal.     Left Ear: Tympanic membrane, ear canal and external ear normal.     Nose: Congestion present.     Right Turbinates: Enlarged and swollen.     Left Turbinates: Enlarged and swollen.     Right Sinus: Maxillary sinus tenderness and frontal sinus tenderness present.     Left Sinus: No maxillary sinus tenderness or frontal sinus tenderness.     Mouth/Throat:      Lips: Pink.     Mouth: Mucous membranes are moist. No injury.     Pharynx: Oropharynx is clear. Uvula midline. No pharyngeal swelling.  Eyes:     Extraocular Movements: Extraocular movements intact.     Conjunctiva/sclera: Conjunctivae normal.     Pupils: Pupils are equal, round, and reactive to light.  Cardiovascular:     Rate and Rhythm: Normal rate and regular rhythm.     Pulses: Normal pulses.     Heart sounds: Normal heart sounds.  Pulmonary:     Effort: Pulmonary effort is normal.     Breath sounds: Normal breath sounds.  Abdominal:     General: Bowel sounds are normal.     Palpations: Abdomen is soft.  Musculoskeletal:     Cervical back: Normal range of motion.  Lymphadenopathy:     Cervical: No cervical adenopathy.  Skin:    General: Skin is warm and dry.  Neurological:     General: No focal deficit present.     Mental Status: She is alert and oriented to person, place, and time.  Psychiatric:        Mood and Affect: Mood normal.        Behavior: Behavior normal.      UC Treatments / Results  Labs (all labs ordered are listed, but only abnormal results are displayed) Labs Reviewed - No data to display  EKG   Radiology No results found.  Procedures Procedures (including critical care time)  Medications Ordered in UC Medications  dexamethasone (DECADRON) injection 10 mg (10 mg Intramuscular Given 10/03/22 1903)    Initial Impression / Assessment and Plan / UC Course  I have reviewed the triage vital signs and the nursing notes.  Pertinent labs & imaging results that were available during my care of the patient were reviewed by me and considered in my medical decision making (see chart for details).  The patient is well-appearing, she is in no acute distress, vital signs are stable.  On exam, patient has moderate tenderness to the right maxillary sinus region, and is complaining of tooth pain and severe facial pain.  Symptoms appear to be consistent  with a maxillary sinusitis given the patient's current presentation.  Patient was administered Decadron 10 mg IM to help with facial swelling.  Patient was advised to  attempt to use supportive care recommendations to include fluticasone to see if this helps with her symptoms.  If symptoms do not improve and suddenly worsen, patient has been provided a prescription for Augmentin 875/'125mg'$  for her symptoms.  Additional supportive care recommendations were provided to the patient to include continuing use of NSAIDs or Tylenol for pain or discomfort, and cool compresses to the right side of her face.  She advised to follow-up with her primary care physician if symptoms worsen or fail to improve.  Patient verbalizes understanding.  All questions were answered.  Patient is stable for discharge. Final Clinical Impressions(s) / UC Diagnoses   Final diagnoses:  Acute maxillary sinusitis, recurrence not specified     Discharge Instructions      Take medication as prescribed. Once you complete the Cipro, recommend continued symptomatic treatment for your current symptoms.  If symptoms suddenly worsen, or fail to improve, you can start the antibiotic, Augmentin, that was prescribed. Recommend normal saline nasal spray to help with nasal congestion or runny nose. May take over-the-counter Tylenol or ibuprofen as needed for pain, fever, or general discomfort. Also recommend using a cool cough to the right side of the face to help with pain and swelling. If symptoms fail to improve, recommend following up with your primary care physician for further evaluation. Follow-up as needed.      ED Prescriptions     Medication Sig Dispense Auth. Provider   amoxicillin-clavulanate (AUGMENTIN) 875-125 MG tablet Take 1 tablet by mouth every 12 (twelve) hours. 14 tablet Donielle Radziewicz-Warren, Alda Lea, NP   fluticasone (FLONASE) 50 MCG/ACT nasal spray Place 2 sprays into both nostrils daily. 16 g Garnell Phenix-Warren, Alda Lea,  NP      PDMP not reviewed this encounter.   Tish Men, NP 10/03/22 2028

## 2023-05-05 ENCOUNTER — Other Ambulatory Visit: Payer: Self-pay

## 2023-05-05 DIAGNOSIS — E1169 Type 2 diabetes mellitus with other specified complication: Secondary | ICD-10-CM

## 2023-06-02 ENCOUNTER — Other Ambulatory Visit (INDEPENDENT_AMBULATORY_CARE_PROVIDER_SITE_OTHER): Payer: PRIVATE HEALTH INSURANCE

## 2023-06-02 DIAGNOSIS — E1169 Type 2 diabetes mellitus with other specified complication: Secondary | ICD-10-CM

## 2023-06-02 LAB — LIPID PANEL
Cholesterol: 188 mg/dL (ref 0–200)
HDL: 45.3 mg/dL (ref >39.00)
LDL Cholesterol: 114 mg/dL — ABNORMAL HIGH (ref 0–99)
NonHDL: 143.13
Total CHOL/HDL Ratio: 4
Triglycerides: 148 mg/dL (ref 0.0–149.0)
VLDL: 29.6 mg/dL (ref 0.0–40.0)

## 2023-06-02 LAB — COMPREHENSIVE METABOLIC PANEL
ALT: 16 U/L (ref 0–35)
AST: 17 U/L (ref 0–37)
Albumin: 4.6 g/dL (ref 3.5–5.2)
Alkaline Phosphatase: 45 U/L (ref 39–117)
BUN: 18 mg/dL (ref 6–23)
CO2: 26 mEq/L (ref 19–32)
Calcium: 9.6 mg/dL (ref 8.4–10.5)
Chloride: 102 mEq/L (ref 96–112)
Creatinine, Ser: 0.7 mg/dL (ref 0.40–1.20)
GFR: 94.8 mL/min (ref >60.00)
Glucose, Bld: 117 mg/dL — ABNORMAL HIGH (ref 70–99)
Potassium: 4.2 mEq/L (ref 3.5–5.1)
Sodium: 135 mEq/L (ref 135–145)
Total Bilirubin: 0.4 mg/dL (ref 0.2–1.2)
Total Protein: 7.7 g/dL (ref 6.0–8.3)

## 2023-06-02 LAB — HEMOGLOBIN A1C: Hgb A1c MFr Bld: 6.5 % (ref 4.6–6.5)

## 2023-06-02 LAB — MICROALBUMIN / CREATININE URINE RATIO
Creatinine,U: 14.9 mg/dL
Microalb Creat Ratio: 4.7 mg/g (ref 0.0–30.0)
Microalb, Ur: <0.7 mg/dL (ref 0.0–1.9)

## 2023-06-09 ENCOUNTER — Encounter: Payer: Self-pay | Admitting: "Endocrinology

## 2023-06-09 ENCOUNTER — Ambulatory Visit: Payer: No Typology Code available for payment source | Admitting: "Endocrinology

## 2023-06-09 VITALS — BP 115/70 | HR 82 | Ht 68.0 in | Wt 217.4 lb

## 2023-06-09 DIAGNOSIS — E1140 Type 2 diabetes mellitus with diabetic neuropathy, unspecified: Secondary | ICD-10-CM | POA: Diagnosis not present

## 2023-06-09 DIAGNOSIS — Z7984 Long term (current) use of oral hypoglycemic drugs: Secondary | ICD-10-CM

## 2023-06-09 DIAGNOSIS — E7800 Pure hypercholesterolemia, unspecified: Secondary | ICD-10-CM

## 2023-06-09 MED ORDER — OZEMPIC (0.25 OR 0.5 MG/DOSE) 2 MG/1.5ML ~~LOC~~ SOPN
0.2500 mg | PEN_INJECTOR | SUBCUTANEOUS | 1 refills | Status: DC
Start: 2023-06-09 — End: 2024-01-20

## 2023-06-09 MED ORDER — METFORMIN HCL 1000 MG PO TABS: 1000.0000 mg | ORAL_TABLET | Freq: Two times a day (BID) | ORAL | 4 refills | Status: DC

## 2023-06-09 NOTE — Progress Notes (Signed)
Outpatient Endocrinology Note [place], MD  06/09/23   Rachel Bishop July 05, 1966 [id number]  Referring Provider: Assunta Found, MD Primary Care Provider: Assunta Found, MD Reason for consultation: Subjective   Assessment & Plan  Diagnoses and all orders for this visit:  Type 2 diabetes mellitus with diabetic neuropathy, without long-term current use of insulin (HCC)  Long term (current) use of oral hypoglycemic drugs  Pure hypercholesterolemia  Other orders -     metFORMIN (GLUCOPHAGE) 1000 MG tablet; Take 1 tablet (1,000 mg total) by mouth 2 (two) times daily with a meal. -     Semaglutide,0.25 or 0.5MG /DOS, (OZEMPIC, 0.25 OR 0.5 MG/DOSE,) 2 MG/1.5ML SOPN; Inject 0.25 mg into the skin once a week.    Diabetes Type II complicated by neuropathy,  Lab Results  Component Value Date   GFR 94.80 06/02/2023   Hba1c goal less than 7, current Hba1c is  Lab Results  Component Value Date   HGBA1C 6.5 06/02/2023   Will recommend the following: Glimepiride 2 mg every day Metformin 1000 mg bid Ozempic 0.25 mg weekly  Stop Janumet Patient is not interested in diabetes education at this time   No known contraindications/side effects to any of above medications No history of MEN syndrome/medullary thyroid cancer/pancreatitis or pancreatic cancer in self or family  -Last LD and Tg are as follows: Lab Results  Component Value Date   LDLCALC 114 (H) 06/02/2023    Lab Results  Component Value Date   TRIG 148.0 06/02/2023   -not on statin -Follow low fat diet and exercise  -Will start statin once diabetes medications are settled to avoid polypharmacy at the same time   -Blood pressure goal <140/90 - Microalbumin/creatinine goal is < 30 -Last MA/Cr is as follows: Lab Results  Component Value Date   MICROALBUR <0.7 06/02/2023   -not on ACE/ARB  -diet changes including salt restriction -limit eating outside -counseled BP targets per standards of diabetes  care -uncontrolled blood pressure can lead to retinopathy, nephropathy and cardiovascular and atherosclerotic heart disease  Reviewed and counseled on: -A1C target -Blood sugar targets -Complications of uncontrolled diabetes  -Checking blood sugar before meals and bedtime and bring log next visit -All medications with mechanism of action and side effects -Hypoglycemia management: rule of 15's, Glucagon Emergency Kit and medical alert ID -low-carb low-fat plate-method diet -At least 20 minutes of physical activity per day -Annual dilated retinal eye exam and foot exam -compliance and follow up needs -follow up as scheduled or earlier if problem gets worse  Call if blood sugar is less than 70 or consistently above 250    Take a 15 gm snack of carbohydrate at bedtime before you go to sleep if your blood sugar is less than 100.    If you are going to fast after midnight for a test or procedure, ask your physician for instructions on how to reduce/decrease your insulin dose.    Call if blood sugar is less than 70 or consistently above 250  -Treating a low sugar by rule of 15  (15 gms of sugar every 15 min until sugar is more than 70) If you feel your sugar is low, test your sugar to be sure If your sugar is low (less than 70), then take 15 grams of a fast acting Carbohydrate (3-4 glucose tablets or glucose gel or 4 ounces of juice or regular soda) Recheck your sugar 15 min after treating low to make sure it is more than 70  If sugar is still less than 70, treat again with 15 grams of carbohydrate          Don't drive the hour of hypoglycemia  If unconscious/unable to eat or drink by mouth, use glucagon injection or nasal spray baqsimi and call 911. Can repeat again in 15 min if still unconscious.  Return in about 4 months (around 10/09/2023).   I have reviewed current medications, nurse's notes, allergies, vital signs, past medical and surgical history, family medical history, and social  history for this encounter. Counseled patient on symptoms, examination findings, lab findings, imaging results, treatment decisions and monitoring and prognosis. The patient understood the recommendations and agrees with the treatment plan. All questions regarding treatment plan were fully answered.  Altamese Chatfield, MD  06/09/23    History of Present Illness Rachel Bishop is a 57 y.o. year old female who presents for evaluation of Type II diabetes mellitus.  Rachel Bishop was first diagnosed in 2022.   Diabetes education +  Home diabetes regimen: Glimepiride 2 mg bid Janumet 50/500 bid   COMPLICATIONS -  MI/Stroke -  retinopathy +  neuropathy -  nephropathy  BLOOD SUGAR DATA  CGM interpretation: At today's visit, we reviewed her CGM downloads. The full report is scanned in the media. Reviewing the CGM trends, BG are well controlled, with some lows overnight/morning.  Physical Exam  BP 115/70   Pulse 82   Ht 5\' 8"  (1.727 m)   Wt 217 lb 6.4 oz (98.6 kg)   SpO2 96%   BMI 33.06 kg/m    Constitutional: well developed, well nourished Head: normocephalic, atraumatic Eyes: sclera anicteric, no redness Neck: supple Lungs: normal respiratory effort Neurology: alert and oriented Skin: dry, no appreciable rashes Musculoskeletal: no appreciable defects Psychiatric: normal mood and affect Diabetic Foot Exam - Simple   No data filed      Current Medications Patient's Medications  New Prescriptions   METFORMIN (GLUCOPHAGE) 1000 MG TABLET    Take 1 tablet (1,000 mg total) by mouth 2 (two) times daily with a meal.   SEMAGLUTIDE,0.25 OR 0.5MG /DOS, (OZEMPIC, 0.25 OR 0.5 MG/DOSE,) 2 MG/1.5ML SOPN    Inject 0.25 mg into the skin once a week.  Previous Medications   AMOXICILLIN-CLAVULANATE (AUGMENTIN) 875-125 MG TABLET    Take 1 tablet by mouth every 12 (twelve) hours.   CIPRO 500 MG TABLET    Take 1 tablet every 12 hours by oral route as directed.   FLUTICASONE (FLONASE) 50  MCG/ACT NASAL SPRAY    Place 2 sprays into both nostrils daily.   GLIMEPIRIDE (AMARYL) 1 MG TABLET    Take 20 mg by mouth every 12 (twelve) hours.   MULTIPLE VITAMIN (MULTIVITAMIN WITH MINERALS) TABS TABLET    Take 1 tablet by mouth daily.   MUPIROCIN OINTMENT (BACTROBAN) 2 %    Apply 1 application topically 2 (two) times daily.   OMEGA-3 FATTY ACIDS (FISH OIL) 1000 MG CAPS    Take by mouth.   OXYCODONE (ROXICODONE) 5 MG IMMEDIATE RELEASE TABLET    Take 1 tablet (5 mg total) by mouth every 4 (four) hours as needed for severe pain.   TAMSULOSIN (FLOMAX) 0.4 MG CAPS CAPSULE    Take 1 capsule (0.4 mg total) by mouth daily.  Modified Medications   No medications on file  Discontinued Medications   SITAGLIPTIN-METFORMIN (JANUMET) 50-500 MG TABLET    Take 1 tablet by mouth 2 (two) times daily with a meal.    Allergies  No Known Allergies  Past Medical History Past Medical History:  Diagnosis Date   BCC (basal cell carcinoma of skin) 05/06/2017   bcc sup right lower shin tx cx3 19fu    Past Surgical History Past Surgical History:  Procedure Laterality Date   CHOLECYSTECTOMY      Family History family history is not on file.  Social History Social History   Socioeconomic History   Marital status: Married    Spouse name: Not on file   Number of children: Not on file   Years of education: Not on file   Highest education level: Not on file  Occupational History   Not on file  Tobacco Use   Smoking status: Never   Smokeless tobacco: Never  Vaping Use   Vaping status: Not on file  Substance and Sexual Activity   Alcohol use: Never   Drug use: Never   Sexual activity: Not on file  Other Topics Concern   Not on file  Social History Narrative   Not on file   Social Determinants of Health   Financial Resource Strain: Not on file  Food Insecurity: Not on file  Transportation Needs: Not on file  Physical Activity: Not on file  Stress: Not on file  Social Connections: Not on  file  Intimate Partner Violence: Not on file    Lab Results  Component Value Date   HGBA1C 6.5 06/02/2023   Lab Results  Component Value Date   CHOL 188 06/02/2023   Lab Results  Component Value Date   HDL 45.30 06/02/2023   Lab Results  Component Value Date   LDLCALC 114 (H) 06/02/2023   Lab Results  Component Value Date   TRIG 148.0 06/02/2023   Lab Results  Component Value Date   CHOLHDL 4 06/02/2023   Lab Results  Component Value Date   CREATININE 0.70 06/02/2023   Lab Results  Component Value Date   GFR 94.80 06/02/2023   Lab Results  Component Value Date   MICROALBUR <0.7 06/02/2023      Component Value Date/Time   NA 135 06/02/2023 1053   K 4.2 06/02/2023 1053   CL 102 06/02/2023 1053   CO2 26 06/02/2023 1053   GLUCOSE 117 (H) 06/02/2023 1053   BUN 18 06/02/2023 1053   CREATININE 0.70 06/02/2023 1053   CALCIUM 9.6 06/02/2023 1053   PROT 7.7 06/02/2023 1053   ALBUMIN 4.6 06/02/2023 1053   AST 17 06/02/2023 1053   ALT 16 06/02/2023 1053   ALKPHOS 45 06/02/2023 1053   BILITOT 0.4 06/02/2023 1053   GFRNONAA >60 12/28/2021 0316      Latest Ref Rng & Units 06/02/2023   10:53 AM 12/28/2021    3:16 AM  BMP  Glucose 70 - 99 mg/dL 147  829   BUN 6 - 23 mg/dL 18  24   Creatinine 5.62 - 1.20 mg/dL 1.30  8.65   Sodium 784 - 145 mEq/L 135  135   Potassium 3.5 - 5.1 mEq/L 4.2  4.1   Chloride 96 - 112 mEq/L 102  104   CO2 19 - 32 mEq/L 26  21   Calcium 8.4 - 10.5 mg/dL 9.6  9.4        Component Value Date/Time   WBC 7.0 12/28/2021 0316   RBC 4.66 12/28/2021 0316   HGB 14.5 12/28/2021 0316   HCT 43.6 12/28/2021 0316   PLT 250 12/28/2021 0316   MCV 93.6 12/28/2021 0316   MCH 31.1 12/28/2021  0316   MCHC 33.3 12/28/2021 0316   RDW 11.9 12/28/2021 0316     Parts of this note may have been dictated using voice recognition software. There may be variances in spelling and vocabulary which are unintentional. Not all errors are proofread. Please notify  the [facility] if any discrepancies are noted or if the meaning of any statement is not clear.

## 2023-06-09 NOTE — Patient Instructions (Signed)

## 2023-06-10 ENCOUNTER — Telehealth: Payer: Self-pay

## 2023-06-10 NOTE — Telephone Encounter (Signed)
I spoke to [place] and explained to her this is the standard adult dose of Metformin and I told her if it would make her more comfortable at this time take one 1000 mg daily and wait and see how her blood sugars run and she agreed

## 2023-06-10 NOTE — Telephone Encounter (Signed)
Rachel Bishop called this morning upset regard taking the Metformin, she does not understand why she is taking such a large dose 1000 mg two times daily, she is concerned at this point , please advise?

## 2023-07-09 ENCOUNTER — Other Ambulatory Visit: Payer: Self-pay

## 2023-07-09 MED ORDER — GLIMEPIRIDE 2 MG PO TABS: 2.0000 mg | ORAL_TABLET | Freq: Every day | ORAL | 0 refills | Status: DC

## 2023-08-22 ENCOUNTER — Other Ambulatory Visit: Payer: Self-pay

## 2023-08-22 DIAGNOSIS — E1165 Type 2 diabetes mellitus with hyperglycemia: Secondary | ICD-10-CM

## 2023-08-22 MED ORDER — FREESTYLE LIBRE 3 PLUS SENSOR MISC: 1.0000 | 3 refills | Status: AC

## 2023-09-06 ENCOUNTER — Other Ambulatory Visit: Payer: Self-pay | Admitting: "Endocrinology

## 2023-09-24 ENCOUNTER — Encounter (HOSPITAL_BASED_OUTPATIENT_CLINIC_OR_DEPARTMENT_OTHER): Payer: Self-pay

## 2023-09-24 ENCOUNTER — Emergency Department (HOSPITAL_BASED_OUTPATIENT_CLINIC_OR_DEPARTMENT_OTHER)
Admission: EM | Admit: 2023-09-24 | Discharge: 2023-09-24 | Disposition: A | Discharge status: Home / Self Care | Payer: No Typology Code available for payment source | Attending: Emergency Medicine | Admitting: Emergency Medicine

## 2023-09-24 ENCOUNTER — Emergency Department (HOSPITAL_BASED_OUTPATIENT_CLINIC_OR_DEPARTMENT_OTHER): Payer: No Typology Code available for payment source

## 2023-09-24 ENCOUNTER — Other Ambulatory Visit: Payer: Self-pay

## 2023-09-24 ENCOUNTER — Telehealth (HOSPITAL_BASED_OUTPATIENT_CLINIC_OR_DEPARTMENT_OTHER): Payer: Self-pay | Admitting: Emergency Medicine

## 2023-09-24 DIAGNOSIS — K5792 Diverticulitis of intestine, part unspecified, without perforation or abscess without bleeding: Secondary | ICD-10-CM

## 2023-09-24 DIAGNOSIS — E119 Type 2 diabetes mellitus without complications: Secondary | ICD-10-CM | POA: Insufficient documentation

## 2023-09-24 DIAGNOSIS — R1032 Left lower quadrant pain: Secondary | ICD-10-CM | POA: Diagnosis present

## 2023-09-24 DIAGNOSIS — Z7984 Long term (current) use of oral hypoglycemic drugs: Secondary | ICD-10-CM | POA: Diagnosis not present

## 2023-09-24 DIAGNOSIS — K5732 Diverticulitis of large intestine without perforation or abscess without bleeding: Secondary | ICD-10-CM | POA: Insufficient documentation

## 2023-09-24 HISTORY — DX: Type 2 diabetes mellitus without complications: E11.9

## 2023-09-24 LAB — URINALYSIS, ROUTINE W REFLEX MICROSCOPIC
Bilirubin Urine: NEGATIVE
Glucose, UA: NEGATIVE mg/dL
Hgb urine dipstick: NEGATIVE
Ketones, ur: 15 mg/dL — AB
Leukocytes,Ua: NEGATIVE
Nitrite: NEGATIVE
Protein, ur: NEGATIVE mg/dL
Specific Gravity, Urine: 1.008 (ref 1.005–1.030)
pH: 5 (ref 5.0–8.0)

## 2023-09-24 LAB — COMPREHENSIVE METABOLIC PANEL
ALT: 12 U/L (ref 0–44)
AST: 11 U/L — ABNORMAL LOW (ref 15–41)
Albumin: 4.2 g/dL (ref 3.5–5.0)
Alkaline Phosphatase: 49 U/L (ref 38–126)
Anion gap: 11 (ref 5–15)
BUN: 16 mg/dL (ref 6–20)
CO2: 24 mmol/L (ref 22–32)
Calcium: 9.8 mg/dL (ref 8.9–10.3)
Chloride: 104 mmol/L (ref 98–111)
Creatinine, Ser: 0.66 mg/dL (ref 0.44–1.00)
GFR, Estimated: >60 mL/min (ref >60)
Glucose, Bld: 136 mg/dL — ABNORMAL HIGH (ref 70–99)
Potassium: 3.9 mmol/L (ref 3.5–5.1)
Sodium: 139 mmol/L (ref 135–145)
Total Bilirubin: 0.5 mg/dL (ref <1.2)
Total Protein: 7.5 g/dL (ref 6.5–8.1)

## 2023-09-24 LAB — CBC WITH DIFFERENTIAL/PLATELET
Abs Immature Granulocytes: 0.03 10*3/uL (ref 0.00–0.07)
Basophils Absolute: 0.1 10*3/uL (ref 0.0–0.1)
Basophils Relative: 1 %
Eosinophils Absolute: 0.2 10*3/uL (ref 0.0–0.5)
Eosinophils Relative: 2 %
HCT: 40.8 % (ref 36.0–46.0)
Hemoglobin: 13.8 g/dL (ref 12.0–15.0)
Immature Granulocytes: 0 %
Lymphocytes Relative: 19 %
Lymphs Abs: 2.2 10*3/uL (ref 0.7–4.0)
MCH: 31.3 pg (ref 26.0–34.0)
MCHC: 33.8 g/dL (ref 30.0–36.0)
MCV: 92.5 fL (ref 80.0–100.0)
Monocytes Absolute: 1 10*3/uL (ref 0.1–1.0)
Monocytes Relative: 9 %
Neutro Abs: 8 10*3/uL — ABNORMAL HIGH (ref 1.7–7.7)
Neutrophils Relative %: 69 %
Platelets: 260 10*3/uL (ref 150–400)
RBC: 4.41 MIL/uL (ref 3.87–5.11)
RDW: 12.4 % (ref 11.5–15.5)
WBC: 11.5 10*3/uL — ABNORMAL HIGH (ref 4.0–10.5)
nRBC: 0 % (ref 0.0–0.2)

## 2023-09-24 LAB — LIPASE, BLOOD: Lipase: 19 U/L (ref 11–51)

## 2023-09-24 MED ORDER — MORPHINE SULFATE (PF) 4 MG/ML IV SOLN
4.0000 mg | Freq: Once | INTRAVENOUS | Status: AC
  Administered 2023-09-24: 4 mg via INTRAVENOUS
  Filled 2023-09-24: qty 1

## 2023-09-24 MED ORDER — SODIUM CHLORIDE 0.9 % IV BOLUS
1000.0000 mL | Freq: Once | INTRAVENOUS | Status: AC
  Administered 2023-09-24: 1000 mL via INTRAVENOUS

## 2023-09-24 MED ORDER — CIPROFLOXACIN HCL 500 MG PO TABS: 500.0000 mg | ORAL_TABLET | Freq: Two times a day (BID) | ORAL | 0 refills | Status: DC

## 2023-09-24 MED ORDER — IOHEXOL 300 MG/ML  SOLN
100.0000 mL | Freq: Once | INTRAMUSCULAR | Status: AC | PRN
  Administered 2023-09-24: 100 mL via INTRAVENOUS

## 2023-09-24 MED ORDER — METRONIDAZOLE 500 MG PO TABS: 500.0000 mg | ORAL_TABLET | Freq: Three times a day (TID) | ORAL | 0 refills | Status: DC

## 2023-09-24 MED ORDER — METRONIDAZOLE 500 MG PO TABS
500.0000 mg | ORAL_TABLET | Freq: Once | ORAL | Status: AC
  Administered 2023-09-24: 500 mg via ORAL
  Filled 2023-09-24: qty 1

## 2023-09-24 MED ORDER — CIPROFLOXACIN HCL 500 MG PO TABS
500.0000 mg | ORAL_TABLET | Freq: Once | ORAL | Status: AC
  Administered 2023-09-24: 500 mg via ORAL
  Filled 2023-09-24: qty 1

## 2023-09-24 MED ORDER — ONDANSETRON HCL 4 MG/2ML IJ SOLN
4.0000 mg | Freq: Once | INTRAMUSCULAR | Status: AC
  Administered 2023-09-24: 4 mg via INTRAVENOUS
  Filled 2023-09-24: qty 2

## 2023-09-24 MED ORDER — HYDROCODONE-ACETAMINOPHEN 5-325 MG PO TABS: 1.0000 | ORAL_TABLET | Freq: Four times a day (QID) | ORAL | 0 refills | Status: DC | PRN

## 2023-09-24 NOTE — ED Provider Notes (Signed)
[place] EMERGENCY DEPARTMENT AT [facility] Provider Note   CSN: [id number] Arrival date & time: 09/24/23  0458     History  Chief Complaint  Patient presents with   Abdominal Pain    Rachel Bishop is a 57 y.o. female.  Patient is a 58 year old female with history of type 2 diabetes, prior cholecystectomy.  Patient presenting with complaints of abdominal pain.  She describes pain to her suprapubic region and left lower quadrant that has been worsening since yesterday morning.  She denies any fevers or chills.  She denies diarrhea or constipation.  No urinary complaints.  No aggravating or alleviating factors.  She reports having had diverticulitis in the past, but this feels different.  The history is provided by the patient.       Home Medications Prior to Admission medications   Medication Sig Start Date End Date Taking? Authorizing Provider  amoxicillin-clavulanate (AUGMENTIN) 875-125 MG tablet Take 1 tablet by mouth every 12 (twelve) hours. Patient not taking: Reported on 06/09/2023 10/03/22   Leath-Warren, Sadie Haber, NP  CIPRO 500 MG tablet Take 1 tablet every 12 hours by oral route as directed. Patient not taking: Reported on 06/09/2023    [provider]  Continuous Glucose Sensor (FREESTYLE LIBRE 3 PLUS SENSOR) MISC Inject 1 Device into the skin continuous. Change every 15 days 08/22/23   Altamese Cobden, MD  fluticasone (FLONASE) 50 MCG/ACT nasal spray Place 2 sprays into both nostrils daily. Patient not taking: Reported on 06/09/2023 10/03/22   Leath-Warren, Sadie Haber, NP  glimepiride (AMARYL) 2 MG tablet Take 1 tablet (2 mg total) by mouth daily with breakfast. 07/09/23   Altamese [place], MD  metFORMIN (GLUCOPHAGE) 1000 MG tablet TAKE 1 TABLET BY MOUTH TWICE DAILY WITH A MEAL 09/08/23   Motwani, Carin Hock, MD  Multiple Vitamin (MULTIVITAMIN WITH MINERALS) TABS tablet Take 1 tablet by mouth daily.    [provider]  mupirocin ointment (BACTROBAN)  2 % Apply 1 application topically 2 (two) times daily. Patient not taking: Reported on 06/09/2023 11/03/20   Janalyn Harder, MD  Omega-3 Fatty Acids (FISH OIL) 1000 MG CAPS Take by mouth.    [provider]  oxyCODONE (ROXICODONE) 5 MG immediate release tablet Take 1 tablet (5 mg total) by mouth every 4 (four) hours as needed for severe pain. Patient not taking: Reported on 06/09/2023 12/28/21   Sabas Sous, MD  Semaglutide,0.25 or 0.5MG /DOS, (OZEMPIC, 0.25 OR 0.5 MG/DOSE,) 2 MG/1.5ML SOPN Inject 0.25 mg into the skin once a week. 06/09/23   Altamese , MD  tamsulosin (FLOMAX) 0.4 MG CAPS capsule Take 1 capsule (0.4 mg total) by mouth daily. Patient not taking: Reported on 06/09/2023 12/28/21   Sabas Sous, MD      Allergies    Patient has no known allergies.    Review of Systems   Review of Systems  All other systems reviewed and are negative.   Physical Exam Updated Vital Signs BP 129/79 (BP Location: Right Arm)   Pulse 97   Temp 98.2 F (36.8 C) (Oral)   Resp 17   Ht 5\' 9"  (1.753 m)   Wt 98.4 kg   SpO2 97%   BMI 32.05 kg/m  Physical Exam Vitals and nursing note reviewed.  Constitutional:      General: She is not in acute distress.    Appearance: She is well-developed. She is not diaphoretic.  HENT:     Head: Normocephalic and atraumatic.  Cardiovascular:  Rate and Rhythm: Normal rate and regular rhythm.     Heart sounds: No murmur heard.    No friction rub. No gallop.  Pulmonary:     Effort: Pulmonary effort is normal. No respiratory distress.     Breath sounds: Normal breath sounds. No wheezing.  Abdominal:     General: Bowel sounds are normal. There is no distension.     Palpations: Abdomen is soft.     Tenderness: There is abdominal tenderness in the suprapubic area and left lower quadrant. There is no right CVA tenderness, left CVA tenderness, guarding or rebound.  Musculoskeletal:        General: Normal range of motion.     Cervical back:  Normal range of motion and neck supple.  Skin:    General: Skin is warm and dry.  Neurological:     General: No focal deficit present.     Mental Status: She is alert and oriented to person, place, and time.     ED Results / Procedures / Treatments   Labs (all labs ordered are listed, but only abnormal results are displayed) Labs Reviewed  COMPREHENSIVE METABOLIC PANEL  LIPASE, BLOOD  CBC WITH DIFFERENTIAL/PLATELET  URINALYSIS, ROUTINE W REFLEX MICROSCOPIC    EKG None  Radiology No results found.  Procedures Procedures    Medications Ordered in ED Medications  sodium chloride 0.9 % bolus 1,000 mL (has no administration in time range)  ondansetron (ZOFRAN) injection 4 mg (has no administration in time range)  morphine (PF) 4 MG/ML injection 4 mg (has no administration in time range)    ED Course/ Medical Decision Making/ A&P  Patient is a 57 year old female presenting with left lower quadrant pain as described in the HPI.  Patient arrives with stable vital signs and is afebrile.  She does have significant tenderness to right lower quadrant, but no rebound or guarding.  Laboratory studies obtained including CBC, CMP, and lipase revealing a white count of 11.5, but are otherwise unremarkable.  CT scan of the abdomen and pelvis showing acute diverticulitis.    She has received IV fluids along with morphine for pain and Zofran for nausea.  Patient to be treated with Cipro and Flagyl and pain medication.  To follow-up with gastroenterology.  Final Clinical Impression(s) / ED Diagnoses Final diagnoses:  None    Rx / DC Orders ED Discharge Orders     None         Geoffery Lyons, MD 09/24/23 [phone]

## 2023-09-24 NOTE — Discharge Instructions (Addendum)
Your CAT scan did confirm that you have acute diverticulitis.  Begin taking Cipro and Flagyl as prescribed.  Begin taking hydrocodone as prescribed as needed for pain.  Follow-up with your gastroenterologist and return to the emergency department if you develop worsening pain, high fevers, bloody stools, or for other new and concerning symptoms.

## 2023-09-24 NOTE — ED Triage Notes (Signed)
Pt reports lower abdominal pain beginning yesterday morning on waking and gradually getting worse throughout the day and night. Denies N/V/D/fevers. Pt reports everything else feels normal but that pain is severe. Pain described as sharp cramping pain.

## 2023-09-24 NOTE — ED Notes (Signed)
ED Provider at bedside. 

## 2023-09-24 NOTE — ED Notes (Signed)
Dc instructions reviewed with patient. Patient voiced understanding. Dc with belongings.  °

## 2023-10-06 ENCOUNTER — Other Ambulatory Visit: Payer: Self-pay | Admitting: "Endocrinology

## 2023-10-08 ENCOUNTER — Ambulatory Visit: Payer: No Typology Code available for payment source | Admitting: "Endocrinology

## 2023-11-06 ENCOUNTER — Other Ambulatory Visit: Payer: Self-pay | Admitting: "Endocrinology

## 2023-11-12 ENCOUNTER — Encounter: Payer: Self-pay | Admitting: "Endocrinology

## 2023-11-12 ENCOUNTER — Ambulatory Visit: Payer: 59 | Admitting: "Endocrinology

## 2023-11-12 VITALS — BP 122/70 | HR 88 | Ht 69.0 in | Wt 211.0 lb

## 2023-11-12 DIAGNOSIS — E1140 Type 2 diabetes mellitus with diabetic neuropathy, unspecified: Secondary | ICD-10-CM | POA: Diagnosis not present

## 2023-11-12 DIAGNOSIS — E7800 Pure hypercholesterolemia, unspecified: Secondary | ICD-10-CM | POA: Diagnosis not present

## 2023-11-12 DIAGNOSIS — Z7984 Long term (current) use of oral hypoglycemic drugs: Secondary | ICD-10-CM

## 2023-11-12 LAB — POCT GLYCOSYLATED HEMOGLOBIN (HGB A1C): Hemoglobin A1C: 6 % — AB (ref 4.0–5.6)

## 2023-11-12 MED ORDER — SYNJARDY 5-1000 MG PO TABS
1.0000 | ORAL_TABLET | Freq: Two times a day (BID) | ORAL | 5 refills | Status: AC
Start: 2023-11-12 — End: 2023-12-12

## 2023-11-12 MED ORDER — ATORVASTATIN CALCIUM 10 MG PO TABS: 10.0000 mg | ORAL_TABLET | Freq: Every day | ORAL | 3 refills | Status: AC

## 2023-11-12 NOTE — Progress Notes (Signed)
Outpatient Endocrinology Note Rachel [place], MD  11/12/23   CARLEEN MENZIES 08-23-1966 [id number]  Referring Provider: Assunta Found, MD Primary Care Provider: Assunta Found, MD Reason for consultation: Subjective   Assessment & Plan  [name] was seen today for follow-up.  Diagnoses and all orders for this visit:  Type 2 diabetes mellitus with diabetic neuropathy, without long-term current use of insulin (HCC) -     POCT glycosylated hemoglobin (Hb A1C)  Long term (current) use of oral hypoglycemic drugs  Pure hypercholesterolemia  Other orders -     atorvastatin (LIPITOR) 10 MG tablet; Take 1 tablet (10 mg total) by mouth daily. -     Empagliflozin-metFORMIN HCl (SYNJARDY) 02-999 MG TABS; Take 1 tablet by mouth 2 (two) times daily.    Diabetes Type II complicated by neuropathy,  Lab Results  Component Value Date   GFR 94.80 06/02/2023   Hba1c goal less than 7, current Hba1c is  Lab Results  Component Value Date   HGBA1C 6.0 (A) 11/12/2023   Will recommend the following: Start synjardy 02/999 mg bid  Stop Glimepiride 2 mg every day  StopMetformin 1000 mg bid  Stopped Ozempic 0.25 mg weekly due to abdominal pain and diarrhea  Not interested in GLP1   Stop Janumet Patient is not interested in diabetes education at this time   No known contraindications/side effects to any of above medications No history of MEN syndrome/medullary thyroid cancer/pancreatitis or pancreatic cancer in self or family  -Last LD and Tg are as follows: Lab Results  Component Value Date   LDLCALC 114 (H) 06/02/2023    Lab Results  Component Value Date   TRIG 148.0 06/02/2023   -not on statin -11/12/23: Start atorvastatin 10 mg qhs -Follow low fat diet and exercise  -Will start statin once diabetes medications are settled to avoid polypharmacy at the same time   -Blood pressure goal <140/90 - Microalbumin/creatinine goal is < 30 -Last MA/Cr is as follows: Lab Results   Component Value Date   MICROALBUR <0.7 06/02/2023   -not on ACE/ARB  -diet changes including salt restriction -limit eating outside -counseled BP targets per standards of diabetes care -uncontrolled blood pressure can lead to retinopathy, nephropathy and cardiovascular and atherosclerotic heart disease  Reviewed and counseled on: -A1C target -Blood sugar targets -Complications of uncontrolled diabetes  -Checking blood sugar before meals and bedtime and bring log next visit -All medications with mechanism of action and side effects -Hypoglycemia management: rule of 15's, Glucagon Emergency Kit and medical alert ID -low-carb low-fat plate-method diet -At least 20 minutes of physical activity per day -Annual dilated retinal eye exam and foot exam -compliance and follow up needs -follow up as scheduled or earlier if problem gets worse  Call if blood sugar is less than 70 or consistently above 250    Take a 15 gm snack of carbohydrate at bedtime before you go to sleep if your blood sugar is less than 100.    If you are going to fast after midnight for a test or procedure, ask your physician for instructions on how to reduce/decrease your insulin dose.    Call if blood sugar is less than 70 or consistently above 250  -Treating a low sugar by rule of 15  (15 gms of sugar every 15 min until sugar is more than 70) If you feel your sugar is low, test your sugar to be sure If your sugar is low (less than 70), then take 15 grams  of a fast acting Carbohydrate (3-4 glucose tablets or glucose gel or 4 ounces of juice or regular soda) Recheck your sugar 15 min after treating low to make sure it is more than 70 If sugar is still less than 70, treat again with 15 grams of carbohydrate          Don't drive the hour of hypoglycemia  If unconscious/unable to eat or drink by mouth, use glucagon injection or nasal spray baqsimi and call 911. Can repeat again in 15 min if still unconscious.  Return  in about 4 months (around 03/11/2024).   I have reviewed current medications, nurse's notes, allergies, vital signs, past medical and surgical history, family medical history, and social history for this encounter. Counseled patient on symptoms, examination findings, lab findings, imaging results, treatment decisions and monitoring and prognosis. The patient understood the recommendations and agrees with the treatment plan. All questions regarding treatment plan were fully answered.  Rachel Loveland, MD  11/12/23    History of Present Illness Rachel Bishop is a 58 y.o. year old female who presents for evaluation of Type II diabetes mellitus.  Rachel Bishop was first diagnosed in 2022.   Diabetes education +  Home diabetes regimen: Glimepiride 2 mg qam Metformin 1000 mg bid   COMPLICATIONS -  MI/Stroke -  retinopathy +  neuropathy -  nephropathy  BLOOD SUGAR DATA  CGM interpretation: At today's visit, we reviewed her CGM downloads. The full report is scanned in the media. Reviewing the CGM trends, BG are well controlled, with some highs in daytime.  Physical Exam  BP 122/70   Pulse 88   Ht 5\' 9"  (1.753 m)   Wt 211 lb (95.7 kg)   SpO2 97%   BMI 31.16 kg/m    Constitutional: well developed, well nourished Head: normocephalic, atraumatic Eyes: sclera anicteric, no redness Neck: supple Lungs: normal respiratory effort Neurology: alert and oriented Skin: dry, no appreciable rashes Musculoskeletal: no appreciable defects Psychiatric: normal mood and affect Diabetic Foot Exam - Simple   No data filed      Current Medications Patient's Medications  New Prescriptions   ATORVASTATIN (LIPITOR) 10 MG TABLET    Take 1 tablet (10 mg total) by mouth daily.   EMPAGLIFLOZIN-METFORMIN HCL (SYNJARDY) 02-999 MG TABS    Take 1 tablet by mouth 2 (two) times daily.  Previous Medications   AMOXICILLIN-CLAVULANATE (AUGMENTIN) 875-125 MG TABLET    Take 1 tablet by mouth every 12  (twelve) hours.   CIPROFLOXACIN (CIPRO) 500 MG TABLET    Take 1 tablet (500 mg total) by mouth 2 (two) times daily. One po bid x 7 days   CONTINUOUS GLUCOSE SENSOR (FREESTYLE LIBRE 3 PLUS SENSOR) MISC    Inject 1 Device into the skin continuous. Change every 15 days   FLUTICASONE (FLONASE) 50 MCG/ACT NASAL SPRAY    Place 2 sprays into both nostrils daily.   GLIMEPIRIDE (AMARYL) 2 MG TABLET    TAKE 1 TABLET(2 MG) BY MOUTH DAILY WITH BREAKFAST   HYDROCODONE-ACETAMINOPHEN (NORCO) 5-325 MG TABLET    Take 1-2 tablets by mouth every 6 (six) hours as needed.   METRONIDAZOLE (FLAGYL) 500 MG TABLET    Take 1 tablet (500 mg total) by mouth 3 (three) times daily. One po bid x 7 days   MULTIPLE VITAMIN (MULTIVITAMIN WITH MINERALS) TABS TABLET    Take 1 tablet by mouth daily.   MUPIROCIN OINTMENT (BACTROBAN) 2 %    Apply 1 application topically 2 (  two) times daily.   OMEGA-3 FATTY ACIDS (FISH OIL) 1000 MG CAPS    Take by mouth.   OXYCODONE (ROXICODONE) 5 MG IMMEDIATE RELEASE TABLET    Take 1 tablet (5 mg total) by mouth every 4 (four) hours as needed for severe pain.   SEMAGLUTIDE,0.25 OR 0.5MG /DOS, (OZEMPIC, 0.25 OR 0.5 MG/DOSE,) 2 MG/1.5ML SOPN    Inject 0.25 mg into the skin once a week.   TAMSULOSIN (FLOMAX) 0.4 MG CAPS CAPSULE    Take 1 capsule (0.4 mg total) by mouth daily.  Modified Medications   No medications on file  Discontinued Medications   METFORMIN (GLUCOPHAGE) 1000 MG TABLET    TAKE 1 TABLET BY MOUTH TWICE DAILY WITH A MEAL    Allergies No Known Allergies  Past Medical History Past Medical History:  Diagnosis Date   BCC (basal cell carcinoma of skin) 05/06/2017   bcc sup right lower shin tx cx3 7fu   Type 2 diabetes mellitus (HCC)     Past Surgical History Past Surgical History:  Procedure Laterality Date   CHOLECYSTECTOMY      Family History family history is not on file.  Social History Social History   Socioeconomic History   Marital status: Married    Spouse name:  Not on file   Number of children: Not on file   Years of education: Not on file   Highest education level: Not on file  Occupational History   Not on file  Tobacco Use   Smoking status: Never   Smokeless tobacco: Never  Vaping Use   Vaping status: Not on file  Substance and Sexual Activity   Alcohol use: Never   Drug use: Never   Sexual activity: Not on file  Other Topics Concern   Not on file  Social History Narrative   Not on file   Social Drivers of Health   Financial Resource Strain: Not on file  Food Insecurity: Not on file  Transportation Needs: Not on file  Physical Activity: Not on file  Stress: Not on file  Social Connections: Not on file  Intimate Partner Violence: Not on file    Lab Results  Component Value Date   HGBA1C 6.0 (A) 11/12/2023   HGBA1C 6.5 06/02/2023   Lab Results  Component Value Date   CHOL 188 06/02/2023   Lab Results  Component Value Date   HDL 45.30 06/02/2023   Lab Results  Component Value Date   LDLCALC 114 (H) 06/02/2023   Lab Results  Component Value Date   TRIG 148.0 06/02/2023   Lab Results  Component Value Date   CHOLHDL 4 06/02/2023   Lab Results  Component Value Date   CREATININE 0.66 09/24/2023   Lab Results  Component Value Date   GFR 94.80 06/02/2023   Lab Results  Component Value Date   MICROALBUR <0.7 06/02/2023      Component Value Date/Time   NA 139 09/24/2023 0533   K 3.9 09/24/2023 0533   CL 104 09/24/2023 0533   CO2 24 09/24/2023 0533   GLUCOSE 136 (H) 09/24/2023 0533   BUN 16 09/24/2023 0533   CREATININE 0.66 09/24/2023 0533   CALCIUM 9.8 09/24/2023 0533   PROT 7.5 09/24/2023 0533   ALBUMIN 4.2 09/24/2023 0533   AST 11 (L) 09/24/2023 0533   ALT 12 09/24/2023 0533   ALKPHOS 49 09/24/2023 0533   BILITOT 0.5 09/24/2023 0533   GFRNONAA >60 09/24/2023 0533      Latest Ref Rng &  Units 09/24/2023    5:33 AM 06/02/2023   10:53 AM 12/28/2021    3:16 AM  BMP  Glucose 70 - 99 mg/dL 478  295   621   BUN 6 - 20 mg/dL 16  18  24    Creatinine 0.44 - 1.00 mg/dL 3.08  6.57  8.46   Sodium 135 - 145 mmol/L 139  135  135   Potassium 3.5 - 5.1 mmol/L 3.9  4.2  4.1   Chloride 98 - 111 mmol/L 104  102  104   CO2 22 - 32 mmol/L 24  26  21    Calcium 8.9 - 10.3 mg/dL 9.8  9.6  9.4        Component Value Date/Time   WBC 11.5 (H) 09/24/2023 0533   RBC 4.41 09/24/2023 0533   HGB 13.8 09/24/2023 0533   HCT 40.8 09/24/2023 0533   PLT 260 09/24/2023 0533   MCV 92.5 09/24/2023 0533   MCH 31.3 09/24/2023 0533   MCHC 33.8 09/24/2023 0533   RDW 12.4 09/24/2023 0533   LYMPHSABS 2.2 09/24/2023 0533   MONOABS 1.0 09/24/2023 0533   EOSABS 0.2 09/24/2023 0533   BASOSABS 0.1 09/24/2023 0533     Parts of this note may have been dictated using voice recognition software. There may be variances in spelling and vocabulary which are unintentional. Not all errors are proofread. Please notify the Thereasa Parkin if any discrepancies are noted or if the meaning of any statement is not clear.

## 2023-11-13 ENCOUNTER — Encounter: Payer: Self-pay | Admitting: "Endocrinology

## 2023-11-24 ENCOUNTER — Telehealth: Payer: Self-pay | Admitting: Gastroenterology

## 2023-11-24 NOTE — Telephone Encounter (Signed)
Good Afternoon Dr. Tomasa Rand,    Supervising MD in the PM    I received a call from this patient wishing to schedule a colonoscopy with [place] upon a referral we had received from her primary care. Patient had a colonoscopy with [facility] back in 2017. We have received both her colonoscopy report and pathology for you to review them in the media tab. Would you please advise on scheduling.     Thank you.

## 2023-11-25 ENCOUNTER — Encounter: Payer: Self-pay | Admitting: Gastroenterology

## 2024-01-03 ENCOUNTER — Other Ambulatory Visit: Payer: Self-pay | Admitting: "Endocrinology

## 2024-01-05 NOTE — Telephone Encounter (Signed)
 Requested Prescriptions   Pending Prescriptions Disp Refills   glimepiride (AMARYL) 2 MG tablet [Pharmacy Med Name: GLIMEPIRIDE 2MG  TABLETS] 90 tablet 0    Sig: TAKE 1 TABLET(2 MG) BY MOUTH DAILY WITH BREAKFAST

## 2024-01-13 ENCOUNTER — Ambulatory Visit (AMBULATORY_SURGERY_CENTER): Payer: 59

## 2024-01-13 VITALS — Ht 69.0 in | Wt 217.0 lb

## 2024-01-13 DIAGNOSIS — Z860100 Personal history of colon polyps, unspecified: Secondary | ICD-10-CM

## 2024-01-13 MED ORDER — SUFLAVE 178.7 G PO SOLR: 1.0000 | Freq: Once | ORAL | 0 refills | Status: AC

## 2024-01-13 MED ORDER — ONDANSETRON HCL 4 MG PO TABS: 4.0000 mg | ORAL_TABLET | ORAL | 0 refills | Status: AC

## 2024-01-13 NOTE — Progress Notes (Signed)

## 2024-01-15 ENCOUNTER — Encounter: Payer: Self-pay | Admitting: Gastroenterology

## 2024-01-20 ENCOUNTER — Encounter: Payer: Self-pay | Admitting: Gastroenterology

## 2024-01-20 ENCOUNTER — Ambulatory Visit (AMBULATORY_SURGERY_CENTER): Payer: 59 | Admitting: Gastroenterology

## 2024-01-20 VITALS — BP 104/61 | HR 84 | Temp 98.2°F | Resp 13 | Ht 69.0 in | Wt 217.0 lb

## 2024-01-20 DIAGNOSIS — Z860100 Personal history of colon polyps, unspecified: Secondary | ICD-10-CM

## 2024-01-20 DIAGNOSIS — D124 Benign neoplasm of descending colon: Secondary | ICD-10-CM

## 2024-01-20 DIAGNOSIS — K5730 Diverticulosis of large intestine without perforation or abscess without bleeding: Secondary | ICD-10-CM | POA: Diagnosis not present

## 2024-01-20 DIAGNOSIS — K640 First degree hemorrhoids: Secondary | ICD-10-CM

## 2024-01-20 DIAGNOSIS — Z860101 Personal history of adenomatous and serrated colon polyps: Secondary | ICD-10-CM | POA: Diagnosis not present

## 2024-01-20 DIAGNOSIS — Z1211 Encounter for screening for malignant neoplasm of colon: Secondary | ICD-10-CM | POA: Diagnosis present

## 2024-01-20 MED ORDER — SODIUM CHLORIDE 0.9 % IV SOLN: 500.0000 mL | INTRAVENOUS | Status: DC

## 2024-01-20 NOTE — Patient Instructions (Addendum)
Recommend a high fiber diet   YOU HAD AN ENDOSCOPIC PROCEDURE TODAY AT THE [facility]:   Refer to the procedure report that was given to you for any specific questions about what was found during the examination.  If the procedure report does not answer your questions, please call your gastroenterologist to clarify.  If you requested that your care partner not be given the details of your procedure findings, then the procedure report has been included in a sealed envelope for you to review at your convenience later.  YOU SHOULD EXPECT: Some feelings of bloating in the abdomen. Passage of more gas than usual.  Walking can help get rid of the air that was put into your GI tract during the procedure and reduce the bloating. If you had a lower endoscopy (such as a colonoscopy or flexible sigmoidoscopy) you may notice spotting of blood in your stool or on the toilet paper. If you underwent a bowel prep for your procedure, you may not have a normal bowel movement for a few days.  Please Note:  You might notice some irritation and congestion in your nose or some drainage.  This is from the oxygen used during your procedure.  There is no need for concern and it should clear up in a day or so.  SYMPTOMS TO REPORT IMMEDIATELY:  Following lower endoscopy (colonoscopy or flexible sigmoidoscopy):  Excessive amounts of blood in the stool  Significant tenderness or worsening of abdominal pains  Swelling of the abdomen that is new, acute  Fever of 100F or higher  For urgent or emergent issues, a gastroenterologist can be reached at any hour by calling [phone]. Do not use MyChart messaging for urgent concerns.    DIET:  We do recommend a small meal at first, but then you may proceed to your regular diet.  Drink plenty of fluids but you should avoid alcoholic beverages for 24 hours.  ACTIVITY:  You should plan to take it easy for the rest of today and you should NOT DRIVE or use heavy  machinery until tomorrow (because of the sedation medicines used during the test).    FOLLOW UP: Our staff will call the number listed on your records the next business day following your procedure.  We will call around 7:15- 8:00 am to check on you and address any questions or concerns that you may have regarding the information given to you following your procedure. If we do not reach you, we will leave a message.     If any biopsies were taken you will be contacted by phone or by letter within the next 1-3 weeks.  Please call us at [phone] if you have not heard about the biopsies in 3 weeks.    SIGNATURES/CONFIDENTIALITY: You and/or your care partner have signed paperwork which will be entered into your electronic medical record.  These signatures attest to the fact that that the information above on your After Visit Summary has been reviewed and is understood.  Full responsibility of the confidentiality of this discharge information lies with you and/or your care-partner.

## 2024-01-20 NOTE — Op Note (Signed)
 [facility] Patient Name: Rachel Bishop Procedure Date: 01/20/2024 7:55 AM MRN: [id number] Endoscopist: Lorin Picket E. Tomasa Rand , MD, [id number] Age: 58 Referring MD:  Date of Birth: [birth year] Gender: Female Account #: [id number] Procedure:                Colonoscopy Indications:              Surveillance: Personal history of adenomatous                            polyps on last colonoscopy > 5 years ago (2017, 2                            tubular adenomas) Medicines:                Monitored Anesthesia Care Procedure:                Pre-Anesthesia Assessment:                           - Prior to the procedure, a History and Physical                            was performed, and patient medications and                            allergies were reviewed. The patient's tolerance of                            previous anesthesia was also reviewed. The risks                            and benefits of the procedure and the sedation                            options and risks were discussed with the patient.                            All questions were answered, and informed consent                            was obtained. Prior Anticoagulants: The patient has                            taken no anticoagulant or antiplatelet agents. ASA                            Grade Assessment: II - A patient with mild systemic                            disease. After reviewing the risks and benefits,                            the patient was deemed in satisfactory condition to  undergo the procedure.                           After obtaining informed consent, the colonoscope                            was passed under direct vision. Throughout the                            procedure, the patient's blood pressure, pulse, and                            oxygen saturations were monitored continuously. The                            Olympus Scope [id number] was introduced  through the                            anus and advanced to the the terminal ileum, with                            identification of the appendiceal orifice and IC                            valve. The colonoscopy was performed without                            difficulty. The patient tolerated the procedure                            well. The quality of the bowel preparation was                            adequate. The terminal ileum, ileocecal valve,                            appendiceal orifice, and rectum were photographed.                            The bowel preparation used was SUFLAVE via split                            dose instruction. Scope In: 8:01:36 AM Scope Out: 8:19:25 AM Scope Withdrawal Time: 0 hours 12 minutes 36 seconds  Total Procedure Duration: 0 hours 17 minutes 49 seconds  Findings:                 The perianal and digital rectal examinations were                            normal. Pertinent negatives include normal                            sphincter tone and no palpable rectal lesions.  A 4 mm polyp was found in the descending colon. The                            polyp was sessile. The polyp was removed with a                            cold snare. Resection and retrieval were complete.                            Estimated blood loss was minimal.                           Many large-mouthed and small-mouthed diverticula                            were found in the sigmoid colon, descending colon,                            transverse colon and ascending colon.                            Peri-diverticular erythema was seen. There was                            evidence of an impacted diverticulum.                           The exam was otherwise normal throughout the                            examined colon.                           The terminal ileum appeared normal.                           Non-bleeding internal hemorrhoids  were found during                            retroflexion. The hemorrhoids were Grade I                            (internal hemorrhoids that do not prolapse).                           No additional abnormalities were found on                            retroflexion. Complications:            No immediate complications. Estimated Blood Loss:     Estimated blood loss was minimal. Impression:               - One 4 mm polyp in the descending colon, removed  with a cold snare. Resected and retrieved.                           - Severe diverticulosis in the sigmoid colon, in                            the descending colon, in the transverse colon and                            in the ascending colon. Peri-diverticular erythema                            was seen. There was evidence of an impacted                            diverticulum.                           - The examined portion of the ileum was normal.                           - Non-bleeding internal hemorrhoids. Recommendation:           - Patient has a contact number available for                            emergencies. The signs and symptoms of potential                            delayed complications were discussed with the                            patient. Return to normal activities tomorrow.                            Written discharge instructions were provided to the                            patient.                           - Resume previous diet.                           - Continue present medications.                           - Await pathology results.                           - Repeat colonoscopy (date not yet determined) for                            surveillance based on pathology results.                           -  Recommend high fiber diet/daily fiber supplement                            to reduce risk of diverticular complications Meshelle Holness E. Tomasa Rand, MD 01/20/2024 8:27:30  AM This report has been signed electronically.

## 2024-01-20 NOTE — Progress Notes (Signed)
 [facility] History and Physical   Primary Care Physician:  Assunta Found, MD   Reason for Procedure:   Colon cancer screening/polyp surveillance  Plan:    Surveillance colonoscopy     HPI: Rachel Bishop is a 58 y.o. female undergoing surveillance colonoscopy.  She has no family history of colon cancer and no chronic GI symptoms. She had a colonoscopy with Dr. Loreta Ave in 2017.  3 polyps were removed, 2 tubular adenomas and 1 hyperplastic polyp    Past Medical History:  Diagnosis Date   BCC (basal cell carcinoma of skin) 05/06/2017   bcc sup right lower shin tx cx3 20fu   Hyperlipidemia    Type 2 diabetes mellitus (HCC)     Past Surgical History:  Procedure Laterality Date   CHOLECYSTECTOMY      Prior to Admission medications   Medication Sig Start Date End Date Taking? Authorizing Provider  Cholecalciferol (VITAMIN D3) 25 MCG CAPS daily.   Yes [provider]  Continuous Glucose Sensor (FREESTYLE LIBRE 3 PLUS SENSOR) MISC Inject 1 Device into the skin continuous. Change every 15 days 08/22/23  Yes Motwani, Komal, MD  MAGNESIUM ASPARTATE PO daily.   Yes [provider]  Multiple Vitamin (MULTIVITAMIN WITH MINERALS) TABS tablet Take 1 tablet by mouth daily.   Yes [provider]  Omega-3 Fatty Acids (FISH OIL) 1000 MG CAPS Take by mouth.   Yes [provider]  ondansetron (ZOFRAN) 4 MG tablet Take 1 tablet (4 mg total) by mouth as directed. Take one Zofran 4 mg tablet 30-60 minutes before each colonoscopy prep dose 01/13/24  Yes Kensleigh Gates, Dub Amis, MD  SYNJARDY 02-999 MG TABS Take 1 tablet by mouth 2 (two) times daily. 01/08/24  Yes [provider]  atorvastatin (LIPITOR) 10 MG tablet Take 1 tablet (10 mg total) by mouth daily. Patient not taking: Reported on 01/20/2024 11/12/23   Altamese St. Martin, MD  fluticasone (FLONASE) 50 MCG/ACT nasal spray Place 2 sprays into both nostrils daily. Patient not taking: Reported on 01/20/2024  10/03/22   Leath-Warren, Sadie Haber, NP  metroNIDAZOLE (FLAGYL) 500 MG tablet Take 1 tablet (500 mg total) by mouth 3 (three) times daily. One po bid x 7 days Patient not taking: Reported on 11/12/2023 09/24/23   Geoffery Lyons, MD    Current Outpatient Medications  Medication Sig Dispense Refill   Cholecalciferol (VITAMIN D3) 25 MCG CAPS daily.     Continuous Glucose Sensor (FREESTYLE LIBRE 3 PLUS SENSOR) MISC Inject 1 Device into the skin continuous. Change every 15 days 6 each 3   MAGNESIUM ASPARTATE PO daily.     Multiple Vitamin (MULTIVITAMIN WITH MINERALS) TABS tablet Take 1 tablet by mouth daily.     Omega-3 Fatty Acids (FISH OIL) 1000 MG CAPS Take by mouth.     ondansetron (ZOFRAN) 4 MG tablet Take 1 tablet (4 mg total) by mouth as directed. Take one Zofran 4 mg tablet 30-60 minutes before each colonoscopy prep dose 4 tablet 0   SYNJARDY 02-999 MG TABS Take 1 tablet by mouth 2 (two) times daily.     atorvastatin (LIPITOR) 10 MG tablet Take 1 tablet (10 mg total) by mouth daily. (Patient not taking: Reported on 01/20/2024) 90 tablet 3   fluticasone (FLONASE) 50 MCG/ACT nasal spray Place 2 sprays into both nostrils daily. (Patient not taking: Reported on 01/20/2024) 16 g 0   metroNIDAZOLE (FLAGYL) 500 MG tablet Take 1 tablet (500 mg total) by mouth 3 (three) times daily. One po  bid x 7 days (Patient not taking: Reported on 11/12/2023) 21 tablet 0   Current Facility-Administered Medications  Medication Dose Route Frequency Provider Last Rate Last Admin   0.9 %  sodium chloride infusion  500 mL Intravenous Continuous Jenel Lucks, MD        Allergies as of 01/20/2024   (No Known Allergies)    Family History  Problem Relation Age of Onset   Bladder Cancer Maternal Grandmother    Colon cancer Neg Hx    Colon polyps Neg Hx    Esophageal cancer Neg Hx    Rectal cancer Neg Hx    Stomach cancer Neg Hx     Social History   Socioeconomic History   Marital status: Married    Spouse  name: Not on file   Number of children: Not on file   Years of education: Not on file   Highest education level: Not on file  Occupational History   Not on file  Tobacco Use   Smoking status: Never   Smokeless tobacco: Never  Vaping Use   Vaping status: Not on file  Substance and Sexual Activity   Alcohol use: Never   Drug use: Never   Sexual activity: Not on file  Other Topics Concern   Not on file  Social History Narrative   Not on file   Social Drivers of Health   Financial Resource Strain: Not on file  Food Insecurity: Not on file  Transportation Needs: Not on file  Physical Activity: Not on file  Stress: Not on file  Social Connections: Not on file  Intimate Partner Violence: Not on file    Review of Systems:  All other review of systems negative except as mentioned in the HPI.  Physical Exam: Vital signs BP 109/61   Pulse 93   Temp 98.2 F (36.8 C) (Temporal)   Ht 5\' 9"  (1.753 m)   Wt 217 lb (98.4 kg)   SpO2 97%   BMI 32.05 kg/m   General:   Alert,  Well-developed, well-nourished, pleasant and cooperative in NAD Airway:  Mallampati 1 Lungs:  Clear throughout to auscultation.   Heart:  Regular rate and rhythm; no murmurs, clicks, rubs,  or gallops. Abdomen:  Soft, nontender and nondistended. Normal bowel sounds.   Neuro/Psych:  Normal mood and affect. A and O x 3   Shanice Poznanski E. Tomasa Rand, MD [facility]

## 2024-01-20 NOTE — Progress Notes (Signed)
 Pt's states no medical or surgical changes since previsit or office visit.

## 2024-01-20 NOTE — Progress Notes (Signed)
 Report to PACU, RN, vss, BBS= Clear.

## 2024-01-20 NOTE — Progress Notes (Signed)
 Called to room to assist during endoscopic procedure.  Patient ID and intended procedure confirmed with present staff. Received instructions for my participation in the procedure from the performing physician.

## 2024-01-21 ENCOUNTER — Telehealth: Payer: Self-pay | Admitting: *Deleted

## 2024-01-21 NOTE — Telephone Encounter (Signed)
  Follow up Call-     01/20/2024    7:13 AM  Call back number  Post procedure Call Back phone  # [phone]  Permission to leave phone message Yes   Left message to call if any needs or concerns

## 2024-01-22 LAB — SURGICAL PATHOLOGY

## 2024-01-26 ENCOUNTER — Encounter: Payer: Self-pay | Admitting: Gastroenterology

## 2024-01-26 NOTE — Progress Notes (Signed)
 Rachel Bishop,  The polyp which I removed during your recent procedure was proven to be completely benign but is considered a "pre-cancerous" polyp that MAY have grown into cancer if it had not been removed.  Studies shows that at least 20% of women over age 60 and 30% of men over age 69 have pre-cancerous polyps.  Based on current nationally recognized surveillance guidelines, I recommend that you have a repeat colonoscopy in 7 years.   If you develop any new rectal bleeding, abdominal pain or significant bowel habit changes, please contact me before then.

## 2024-03-17 ENCOUNTER — Encounter: Payer: Self-pay | Admitting: "Endocrinology

## 2024-03-17 ENCOUNTER — Ambulatory Visit (INDEPENDENT_AMBULATORY_CARE_PROVIDER_SITE_OTHER): Payer: 59 | Admitting: "Endocrinology

## 2024-03-17 VITALS — BP 110/70 | HR 74 | Ht 69.0 in | Wt 202.0 lb

## 2024-03-17 DIAGNOSIS — E1140 Type 2 diabetes mellitus with diabetic neuropathy, unspecified: Secondary | ICD-10-CM

## 2024-03-17 DIAGNOSIS — Z7984 Long term (current) use of oral hypoglycemic drugs: Secondary | ICD-10-CM

## 2024-03-17 DIAGNOSIS — E7800 Pure hypercholesterolemia, unspecified: Secondary | ICD-10-CM | POA: Diagnosis not present

## 2024-03-17 LAB — POCT GLYCOSYLATED HEMOGLOBIN (HGB A1C): Hemoglobin A1C: 6.9 % — AB (ref 4.0–5.6)

## 2024-03-17 MED ORDER — SYNJARDY 12.5-1000 MG PO TABS: 1.0000 | ORAL_TABLET | Freq: Two times a day (BID) | ORAL | 1 refills | Status: DC

## 2024-03-17 NOTE — Progress Notes (Signed)
 Outpatient Endocrinology Note Jorge Newcomer, MD  03/17/24   Rachel Bishop 60/07/1966 [id number]  Referring Provider: Minus Amel, MD Primary Care Provider: Minus Amel, MD Reason for consultation: Subjective   Assessment & Plan  Diagnoses and all orders for this visit:  Type 2 diabetes mellitus with diabetic neuropathy, without long-term current use of insulin (HCC) -     POCT glycosylated hemoglobin (Hb A1C)  Long term (current) use of oral hypoglycemic drugs  Pure hypercholesterolemia  Other orders -     Empagliflozin-metFORMIN  HCl (SYNJARDY ) 12.02-999 MG TABS; Take 1 tablet by mouth 2 (two) times daily.   Diabetes Type II complicated by neuropathy,  Lab Results  Component Value Date   GFR 94.80 06/02/2023   Hba1c goal less than 7, current Hba1c is  Lab Results  Component Value Date   HGBA1C 6.9 (A) 03/17/2024   Will recommend the following: Synjardy  12.02/999 mg bid  Stop Glimepiride  2 mg every day  Stop Metformin  1000 mg bid Stopped Ozempic  0.25 mg weekly due to abdominal pain and diarrhea  Not interested in GLP1   Stop Janumet Patient is not interested in diabetes education at this time   No known contraindications/side effects to any of above medications No history of MEN syndrome/medullary thyroid cancer/pancreatitis or pancreatic cancer in self or family  -Last LD and Tg are as follows: Lab Results  Component Value Date   LDLCALC 114 (H) 06/02/2023    Lab Results  Component Value Date   TRIG 148.0 06/02/2023   -not on statin -11/12/23: Started atorvastatin  10 mg qhs -Follow low fat diet and exercise  -Will start statin once diabetes medications are settled to avoid polypharmacy at the same time   -Blood pressure goal <140/90 - Microalbumin/creatinine goal is < 30 -Last MA/Cr is as follows: Lab Results  Component Value Date   MICROALBUR <0.7 06/02/2023   -not on ACE/ARB  -diet changes including salt restriction -limit eating  outside -counseled BP targets per standards of diabetes care -uncontrolled blood pressure can lead to retinopathy, nephropathy and cardiovascular and atherosclerotic heart disease  Reviewed and counseled on: -A1C target -Blood sugar targets -Complications of uncontrolled diabetes  -Checking blood sugar before meals and bedtime and bring log next visit -All medications with mechanism of action and side effects -Hypoglycemia management: rule of 15's, Glucagon Emergency Kit and medical alert ID -low-carb low-fat plate-method diet -At least 20 minutes of physical activity per day -Annual dilated retinal eye exam and foot exam -compliance and follow up needs -follow up as scheduled or earlier if problem gets worse  Call if blood sugar is less than 70 or consistently above 250    Take a 15 gm snack of carbohydrate at bedtime before you go to sleep if your blood sugar is less than 100.    If you are going to fast after midnight for a test or procedure, ask your physician for instructions on how to reduce/decrease your insulin dose.    Call if blood sugar is less than 70 or consistently above 250  -Treating a low sugar by rule of 15  (15 gms of sugar every 15 min until sugar is more than 70) If you feel your sugar is low, test your sugar to be sure If your sugar is low (less than 70), then take 15 grams of a fast acting Carbohydrate (3-4 glucose tablets or glucose gel or 4 ounces of juice or regular soda) Recheck your sugar 15 min after treating low to  make sure it is more than 70 If sugar is still less than 70, treat again with 15 grams of carbohydrate          Don't drive the hour of hypoglycemia  If unconscious/unable to eat or drink by mouth, use glucagon injection or nasal spray baqsimi and call 911. Can repeat again in 15 min if still unconscious.  Return in about 3 months (around 06/17/2024) for visit and 8 am labs before next visit.   I have reviewed current medications, nurse's  notes, allergies, vital signs, past medical and surgical history, family medical history, and social history for this encounter. Counseled patient on symptoms, examination findings, lab findings, imaging results, treatment decisions and monitoring and prognosis. The patient understood the recommendations and agrees with the treatment plan. All questions regarding treatment plan were fully answered.  Jorge Newcomer, MD  03/17/24    History of Present Illness Rachel Bishop is a 58 y.o. year old female who presents for follow up of Type II diabetes mellitus.  Rachel Bishop was first diagnosed in 2022.   Diabetes education +  Home diabetes regimen: Synjardy  5/1000mg  twice daily  Previously on, Glimepiride  2 mg qam Metformin  1000 mg bid   COMPLICATIONS -  MI/Stroke -  retinopathy +  neuropathy -  nephropathy  BLOOD SUGAR DATA  CGM interpretation: At today's visit, we reviewed her CGM downloads. The full report is scanned in the media. Reviewing the CGM trends, BG are well controlled, with some highs in daytime.  Physical Exam  BP 110/70   Pulse 74   Ht 5\' 9"  (1.753 m)   Wt 202 lb (91.6 kg)   SpO2 98%   BMI 29.83 kg/m    Constitutional: well developed, well nourished Head: normocephalic, atraumatic Eyes: sclera anicteric, no redness Neck: supple Lungs: normal respiratory effort Neurology: alert and oriented Skin: dry, no appreciable rashes Musculoskeletal: no appreciable defects Psychiatric: normal mood and affect Diabetic Foot Exam - Simple   No data filed      Current Medications Patient's Medications  New Prescriptions   EMPAGLIFLOZIN-METFORMIN  HCL (SYNJARDY ) 12.02-999 MG TABS    Take 1 tablet by mouth 2 (two) times daily.  Previous Medications   ATORVASTATIN  (LIPITOR) 10 MG TABLET    Take 1 tablet (10 mg total) by mouth daily.   CHOLECALCIFEROL (VITAMIN D3) 25 MCG CAPS    daily.   CONTINUOUS GLUCOSE SENSOR (FREESTYLE LIBRE 3 PLUS SENSOR) MISC    Inject 1  Device into the skin continuous. Change every 15 days   FLUTICASONE  (FLONASE ) 50 MCG/ACT NASAL SPRAY    Place 2 sprays into both nostrils daily.   MAGNESIUM ASPARTATE PO    daily.   METRONIDAZOLE  (FLAGYL ) 500 MG TABLET    Take 1 tablet (500 mg total) by mouth 3 (three) times daily. One po bid x 7 days   MULTIPLE VITAMIN (MULTIVITAMIN WITH MINERALS) TABS TABLET    Take 1 tablet by mouth daily.   OMEGA-3 FATTY ACIDS (FISH OIL) 1000 MG CAPS    Take by mouth.   ONDANSETRON  (ZOFRAN ) 4 MG TABLET    Take 1 tablet (4 mg total) by mouth as directed. Take one Zofran  4 mg tablet 30-60 minutes before each colonoscopy prep dose  Modified Medications   No medications on file  Discontinued Medications   SYNJARDY  02-999 MG TABS    Take 1 tablet by mouth 2 (two) times daily.    Allergies No Known Allergies  Past Medical History Past Medical  History:  Diagnosis Date   BCC (basal cell carcinoma of skin) 05/06/2017   bcc sup right lower shin tx cx3 35fu   Hyperlipidemia    Type 2 diabetes mellitus (HCC)     Past Surgical History Past Surgical History:  Procedure Laterality Date   CHOLECYSTECTOMY      Family History family history includes Bladder Cancer in her maternal grandmother.  Social History Social History   Socioeconomic History   Marital status: Married    Spouse name: Not on file   Number of children: Not on file   Years of education: Not on file   Highest education level: Not on file  Occupational History   Not on file  Tobacco Use   Smoking status: Never   Smokeless tobacco: Never  Vaping Use   Vaping status: Not on file  Substance and Sexual Activity   Alcohol use: Never   Drug use: Never   Sexual activity: Not on file  Other Topics Concern   Not on file  Social History Narrative   Not on file   Social Drivers of Health   Financial Resource Strain: Not on file  Food Insecurity: Not on file  Transportation Needs: Not on file  Physical Activity: Not on file   Stress: Not on file  Social Connections: Not on file  Intimate Partner Violence: Not on file    Lab Results  Component Value Date   HGBA1C 6.9 (A) 03/17/2024   HGBA1C 6.0 (A) 11/12/2023   HGBA1C 6.5 06/02/2023   Lab Results  Component Value Date   CHOL 188 06/02/2023   Lab Results  Component Value Date   HDL 45.30 06/02/2023   Lab Results  Component Value Date   LDLCALC 114 (H) 06/02/2023   Lab Results  Component Value Date   TRIG 148.0 06/02/2023   Lab Results  Component Value Date   CHOLHDL 4 06/02/2023   Lab Results  Component Value Date   CREATININE 0.66 09/24/2023   Lab Results  Component Value Date   GFR 94.80 06/02/2023   Lab Results  Component Value Date   MICROALBUR <0.7 06/02/2023      Component Value Date/Time   NA 139 09/24/2023 0533   K 3.9 09/24/2023 0533   CL 104 09/24/2023 0533   CO2 24 09/24/2023 0533   GLUCOSE 136 (H) 09/24/2023 0533   BUN 16 09/24/2023 0533   CREATININE 0.66 09/24/2023 0533   CALCIUM  9.8 09/24/2023 0533   PROT 7.5 09/24/2023 0533   ALBUMIN 4.2 09/24/2023 0533   AST 11 (L) 09/24/2023 0533   ALT 12 09/24/2023 0533   ALKPHOS 49 09/24/2023 0533   BILITOT 0.5 09/24/2023 0533   GFRNONAA >60 09/24/2023 0533      Latest Ref Rng & Units 09/24/2023    5:33 AM 06/02/2023   10:53 AM 12/28/2021    3:16 AM  BMP  Glucose 70 - 99 mg/dL 161  096  045   BUN 6 - 20 mg/dL 16  18  24    Creatinine 0.44 - 1.00 mg/dL 4.09  8.11  9.14   Sodium 135 - 145 mmol/L 139  135  135   Potassium 3.5 - 5.1 mmol/L 3.9  4.2  4.1   Chloride 98 - 111 mmol/L 104  102  104   CO2 22 - 32 mmol/L 24  26  21    Calcium  8.9 - 10.3 mg/dL 9.8  9.6  9.4        Component Value Date/Time  WBC 11.5 (H) 09/24/2023 0533   RBC 4.41 09/24/2023 0533   HGB 13.8 09/24/2023 0533   HCT 40.8 09/24/2023 0533   PLT 260 09/24/2023 0533   MCV 92.5 09/24/2023 0533   MCH 31.3 09/24/2023 0533   MCHC 33.8 09/24/2023 0533   RDW 12.4 09/24/2023 0533   LYMPHSABS 2.2  09/24/2023 0533   MONOABS 1.0 09/24/2023 0533   EOSABS 0.2 09/24/2023 0533   BASOSABS 0.1 09/24/2023 0533     Parts of this note may have been dictated using voice recognition software. There may be variances in spelling and vocabulary which are unintentional. Not all errors are proofread. Please notify the [facility] if any discrepancies are noted or if the meaning of any statement is not clear.

## 2024-04-24 ENCOUNTER — Ambulatory Visit
Admission: EM | Admit: 2024-04-24 | Discharge: 2024-04-24 | Disposition: A | Discharge status: Home / Self Care | Source: Ambulatory Visit | Attending: Nurse Practitioner | Admitting: Nurse Practitioner

## 2024-04-24 ENCOUNTER — Encounter: Payer: Self-pay | Admitting: Emergency Medicine

## 2024-04-24 DIAGNOSIS — Z87442 Personal history of urinary calculi: Secondary | ICD-10-CM | POA: Insufficient documentation

## 2024-04-24 DIAGNOSIS — R399 Unspecified symptoms and signs involving the genitourinary system: Secondary | ICD-10-CM | POA: Insufficient documentation

## 2024-04-24 DIAGNOSIS — H6591 Unspecified nonsuppurative otitis media, right ear: Secondary | ICD-10-CM | POA: Insufficient documentation

## 2024-04-24 LAB — POCT URINALYSIS DIP (MANUAL ENTRY)
Bilirubin, UA: NEGATIVE
Glucose, UA: 500 mg/dL — AB
Nitrite, UA: NEGATIVE
Protein Ur, POC: NEGATIVE mg/dL
Spec Grav, UA: 1.015 (ref 1.010–1.025)
Urobilinogen, UA: 0.2 U/dL
pH, UA: 5 (ref 5.0–8.0)

## 2024-04-24 MED ORDER — FLUTICASONE PROPIONATE 50 MCG/ACT NA SUSP: 2.0000 | Freq: Every day | NASAL | 0 refills | Status: AC

## 2024-04-24 MED ORDER — TAMSULOSIN HCL 0.4 MG PO CAPS: 0.4000 mg | ORAL_CAPSULE | Freq: Every day | ORAL | 0 refills | Status: AC

## 2024-04-24 MED ORDER — PHENAZOPYRIDINE HCL 100 MG PO TABS: 100.0000 mg | ORAL_TABLET | Freq: Three times a day (TID) | ORAL | 0 refills | Status: AC | PRN

## 2024-04-24 NOTE — Discharge Instructions (Addendum)
 Urine culture has been ordered.  You will be contacted if the pending test results are abnormal.  You also have access to your results via MyChart. Take medication as prescribed. Continue to drink plenty of fluids. I have provided a strainer for you to strain your urine with each urination to see if you may pass a kidney stone. Develop a toileting schedule that will allow you to urinate at least every 2 hours. Avoid caffeine such as tea, soda, or coffee while symptoms persist. As discussed, it is recommended that you follow-up with your PCP or with urology for further evaluation if your urine culture is negative and symptoms continue to persist. Go to the emergency department immediately if you experience worsening abdominal pain, fever, chills, or worsening urinary symptoms.  For your ear: You may take over-the-counter Tylenol  as needed for pain or discomfort. Apply warm compresses to help with pain or discomfort. Do not stick or insert anything inside of the ear while symptoms persist. If symptoms fail to improve, you may follow-up with your PCP or with ENT for further evaluation. Follow-up as needed.

## 2024-04-24 NOTE — ED Provider Notes (Signed)
 [facility]    CSN: [id number] Arrival date & time: 04/24/24  0801      History   Chief Complaint No chief complaint on file.   HPI Rachel Bishop is a 58 y.o. female.   The history is provided by the patient.   Patient presents with a 2-day history of pain with urination and right-sided lower abdominal pain.  She denies fever, chills, urinary frequency, urgency, hematuria, decreased urine stream, or vaginal symptoms.  She reports that she also has some low back pain.  Patient reports prior history of kidney stones.  States that she is unsure if she has ever passed the stone.  States that she has not seen urology in the past.  She also complains of pain in the right ear.  She denies ear drainage, decreased hearing, headache, dizziness, fever, or chills.  She reports that she does have underlying history of seasonal allergies.  States she has been taking Claritin-D for her symptoms. Past Medical History:  Diagnosis Date   BCC (basal cell carcinoma of skin) 05/06/2017   bcc sup right lower shin tx cx3 63fu   Hyperlipidemia    Type 2 diabetes mellitus (HCC)     There are no active problems to display for this patient.   Past Surgical History:  Procedure Laterality Date   CHOLECYSTECTOMY      OB History   No obstetric history on file.      Home Medications    Prior to Admission medications   Medication Sig Start Date End Date Taking? Authorizing Provider  fluticasone  (FLONASE ) 50 MCG/ACT nasal spray Place 2 sprays into both nostrils daily. 04/24/24  Yes Leath-Warren, Etta PARAS, NP  phenazopyridine  (PYRIDIUM ) 100 MG tablet Take 1 tablet (100 mg total) by mouth 3 (three) times daily as needed for pain. 04/24/24  Yes Leath-Warren, Etta PARAS, NP  tamsulosin  (FLOMAX ) 0.4 MG CAPS capsule Take 1 capsule (0.4 mg total) by mouth daily after supper. 04/24/24  Yes Leath-Warren, Etta PARAS, NP  atorvastatin  (LIPITOR) 10 MG tablet Take 1 tablet (10 mg total) by mouth daily.  11/12/23   Motwani, Komal, MD  Cholecalciferol (VITAMIN D3) 25 MCG CAPS daily.    [provider]  Continuous Glucose Sensor (FREESTYLE LIBRE 3 PLUS SENSOR) MISC Inject 1 Device into the skin continuous. Change every 15 days 08/22/23   Dartha Ernst, MD  Empagliflozin-metFORMIN  HCl (SYNJARDY ) 12.02-999 MG TABS Take 1 tablet by mouth 2 (two) times daily. 03/17/24 06/15/24  Motwani, Komal, MD  MAGNESIUM ASPARTATE PO daily.    [provider]  Multiple Vitamin (MULTIVITAMIN WITH MINERALS) TABS tablet Take 1 tablet by mouth daily.    [provider]  Omega-3 Fatty Acids (FISH OIL) 1000 MG CAPS Take by mouth.    [provider]  ondansetron  (ZOFRAN ) 4 MG tablet Take 1 tablet (4 mg total) by mouth as directed. Take one Zofran  4 mg tablet 30-60 minutes before each colonoscopy prep dose 01/13/24   Stacia Glendia BRAVO, MD    Family History Family History  Problem Relation Age of Onset   Bladder Cancer Maternal Grandmother    Colon cancer Neg Hx    Colon polyps Neg Hx    Esophageal cancer Neg Hx    Rectal cancer Neg Hx    Stomach cancer Neg Hx     Social History Social History   Tobacco Use   Smoking status: Never   Smokeless tobacco: Never  Substance Use Topics   Alcohol use: Never  Drug use: Never     Allergies   Patient has no known allergies.   Review of Systems Review of Systems Per HPI  Physical Exam Triage Vital Signs ED Triage Vitals  Encounter Vitals Group     BP 04/24/24 0820 125/77     Girls Systolic BP Percentile --      Girls Diastolic BP Percentile --      Boys Systolic BP Percentile --      Boys Diastolic BP Percentile --      Pulse Rate 04/24/24 0820 82     Resp 04/24/24 0820 18     Temp 04/24/24 0820 97.9 F (36.6 C)     Temp Source 04/24/24 0820 Oral     SpO2 04/24/24 0820 96 %     Weight --      Height --      Head Circumference --      Peak Flow --      Pain Score 04/24/24 0821 5     Pain Loc --      Pain  Education --      Exclude from Growth Chart --    No data found.  Updated Vital Signs BP 125/77 (BP Location: Right Arm)   Pulse 82   Temp 97.9 F (36.6 C) (Oral)   Resp 18   SpO2 96%   Visual Acuity Right Eye Distance:   Left Eye Distance:   Bilateral Distance:    Right Eye Near:   Left Eye Near:    Bilateral Near:     Physical Exam Vitals and nursing note reviewed.  Constitutional:      General: She is not in acute distress.    Appearance: Normal appearance.  HENT:     Head: Normocephalic.     Right Ear: Ear canal and external ear normal. A middle ear effusion is present.     Left Ear: Tympanic membrane, ear canal and external ear normal.     Nose: Nose normal.     Right Turbinates: Enlarged and swollen.     Left Turbinates: Enlarged and swollen.     Mouth/Throat:     Lips: Pink.     Mouth: Mucous membranes are moist.     Pharynx: Postnasal drip present. No pharyngeal swelling, oropharyngeal exudate, posterior oropharyngeal erythema or uvula swelling.     Comments: Cobblestoning present to posterior oropharynx  Eyes:     Extraocular Movements: Extraocular movements intact.     Pupils: Pupils are equal, round, and reactive to light.  Cardiovascular:     Rate and Rhythm: Normal rate and regular rhythm.     Pulses: Normal pulses.     Heart sounds: Normal heart sounds.  Pulmonary:     Effort: Pulmonary effort is normal.     Breath sounds: Normal breath sounds.  Abdominal:     General: Bowel sounds are normal.     Palpations: Abdomen is soft.     Tenderness: There is no abdominal tenderness. There is no right CVA tenderness or left CVA tenderness.  Musculoskeletal:     Cervical back: Normal range of motion.  Skin:    General: Skin is warm and dry.  Neurological:     General: No focal deficit present.     Mental Status: She is alert and oriented to person, place, and time.  Psychiatric:        Mood and Affect: Mood normal.        Behavior: Behavior normal.  UC Treatments / Results  Labs (all labs ordered are listed, but only abnormal results are displayed) Labs Reviewed  POCT URINALYSIS DIP (MANUAL ENTRY) - Abnormal; Notable for the following components:      Result Value   Glucose, UA =500 (*)    Ketones, POC UA moderate (40) (*)    Blood, UA trace-intact (*)    Leukocytes, UA Trace (*)    All other components within normal limits  URINE CULTURE    EKG   Radiology No results found.  Procedures Procedures (including critical care time)  Medications Ordered in UC Medications - No data to display  Initial Impression / Assessment and Plan / UC Course  I have reviewed the triage vital signs and the nursing notes.  Pertinent labs & imaging results that were available during my care of the patient were reviewed by me and considered in my medical decision making (see chart for details).  Urinalysis does not indicate an obvious UTI.  She does have blood in her urine and trace leukocytes, will send for culture, cannot rule out kidney stone at this time.  Will start patient on tamsulosin  0.4 mg for possible kidney stone and Pyridium  100 mg for dysuria.  For her ear, will start fluticasone  50 mcg nasal spray.  Supportive care recommendations were provided and discussed with the patient to include fluids, over-the-counter analgesics, avoiding caffeine, and developing a toileting schedule.  For her ear, also recommend over-the-counter analgesics, and warm compresses.  Will provide information for urology for patient to follow-up with if symptoms fail to improve.  Also recommended follow-up with PCP.  Patient was given ER follow-up precautions if urinary symptoms worsen.  She was advised to follow-up with PCP if symptoms fail to improve with her ER.  Patient was in agreement with this plan of care and verbalizes understanding.  All questions were answered.  Patient stable for discharge.   Final Clinical Impressions(s) / UC Diagnoses    Final diagnoses:  UTI symptoms  History of kidney stones  Middle ear effusion, right     Discharge Instructions      Urine culture has been ordered.  You will be contacted if the pending test results are abnormal.  You also have access to your results via MyChart. Take medication as prescribed. Continue to drink plenty of fluids. Develop a toileting schedule that will allow you to urinate at least every 2 hours. Avoid caffeine such as tea, soda, or coffee while symptoms persist. As discussed, it is recommended that you follow-up with your PCP or with urology for further evaluation if your urine culture is negative and symptoms continue to persist. Go to the emergency department immediately if you experience worsening abdominal pain, fever, chills, or worsening urinary symptoms.  For your ear: You may take over-the-counter Tylenol  as needed for pain or discomfort. Apply warm compresses to help with pain or discomfort. Do not stick or insert anything inside of the ear while symptoms persist. If symptoms fail to improve, you may follow-up with your PCP or with ENT for further evaluation. Follow-up as needed.     ED Prescriptions     Medication Sig Dispense Auth. Provider   tamsulosin  (FLOMAX ) 0.4 MG CAPS capsule Take 1 capsule (0.4 mg total) by mouth daily after supper. 30 capsule Leath-Warren, Etta PARAS, NP   fluticasone  (FLONASE ) 50 MCG/ACT nasal spray Place 2 sprays into both nostrils daily. 16 g Leath-Warren, Etta PARAS, NP   phenazopyridine  (PYRIDIUM ) 100 MG tablet Take 1 tablet (  100 mg total) by mouth 3 (three) times daily as needed for pain. 10 tablet Leath-Warren, Etta PARAS, NP      PDMP not reviewed this encounter.   Gilmer Etta PARAS, NP 04/24/24 [phone]

## 2024-04-24 NOTE — ED Triage Notes (Signed)
 Pain on urination and lower right sided abd pain x 2 days.  Also states right ear has been hurting ofr several weeks.

## 2024-04-26 ENCOUNTER — Ambulatory Visit (HOSPITAL_COMMUNITY): Payer: Self-pay

## 2024-04-26 LAB — URINE CULTURE

## 2024-06-07 ENCOUNTER — Other Ambulatory Visit: Payer: Self-pay

## 2024-06-07 DIAGNOSIS — E1165 Type 2 diabetes mellitus with hyperglycemia: Secondary | ICD-10-CM

## 2024-06-15 ENCOUNTER — Other Ambulatory Visit: Payer: Self-pay | Admitting: "Endocrinology

## 2024-06-15 ENCOUNTER — Telehealth: Payer: Self-pay | Admitting: "Endocrinology

## 2024-06-15 NOTE — Telephone Encounter (Signed)
 Spoke to the pharmacist at [facility] regarding pt medication synjardy . Pharmacist said that one of the pharmacy staff accidentally closed out the medication but the she would fix it and call pt.

## 2024-06-15 NOTE — Telephone Encounter (Signed)
 Requested Prescriptions   Pending Prescriptions Disp Refills   SYNJARDY  12.02-999 MG TABS [Pharmacy Med Name: SYNJARDY  12.5-1000MG  TABLETS] 180 tablet 1    Sig: TAKE 1 TABLET BY MOUTH TWICE DAILY

## 2024-06-15 NOTE — Telephone Encounter (Signed)
 Patient is calling to say that she is trying to get a refill on SYNJARDY  12.02-999 MG TABS from   [id number] - [place], [place] - [address] (Ph: [phone])  But she is having trouble getting it.  Patient is having trouble understanding the pharmacy about the reason she cannot get the refill.  She thinks it may be because it needs a prior authorization, but she is not sure.  Patient states that she will be out of the medication by tomorrow.

## 2024-06-18 ENCOUNTER — Other Ambulatory Visit

## 2024-06-18 DIAGNOSIS — E1165 Type 2 diabetes mellitus with hyperglycemia: Secondary | ICD-10-CM | POA: Diagnosis not present

## 2024-06-18 DIAGNOSIS — Z794 Long term (current) use of insulin: Secondary | ICD-10-CM | POA: Diagnosis not present

## 2024-06-19 LAB — COMPREHENSIVE METABOLIC PANEL WITH GFR
AG Ratio: 1.7 (calc) (ref 1.0–2.5)
ALT: 16 U/L (ref 6–29)
AST: 12 U/L (ref 10–35)
Albumin: 4.5 g/dL (ref 3.6–5.1)
Alkaline phosphatase (APISO): 55 U/L (ref 37–153)
BUN: 17 mg/dL (ref 7–25)
CO2: 28 mmol/L (ref 20–32)
Calcium: 9.6 mg/dL (ref 8.6–10.4)
Chloride: 103 mmol/L (ref 98–110)
Creat: 0.57 mg/dL (ref 0.50–1.05)
Globulin: 2.7 g/dL (ref 1.9–3.7)
Glucose, Bld: 154 mg/dL — ABNORMAL HIGH (ref 65–99)
Potassium: 4 mmol/L (ref 3.5–5.3)
Sodium: 138 mmol/L (ref 135–146)
Total Bilirubin: 0.4 mg/dL (ref 0.2–1.2)
Total Protein: 7.2 g/dL (ref 6.1–8.1)
eGFR: 104 mL/min/1.73m2 (ref >=60)

## 2024-06-19 LAB — MICROALBUMIN / CREATININE URINE RATIO
Creatinine, Urine: 51 mg/dL (ref 20–275)
Microalb Creat Ratio: 8 mg/g{creat} (ref <30)
Microalb, Ur: 0.4 mg/dL

## 2024-06-19 LAB — LIPID PANEL
Cholesterol: 131 mg/dL (ref <200)
HDL: 55 mg/dL (ref >=50)
LDL Cholesterol (Calc): 57 mg/dL
Non-HDL Cholesterol (Calc): 76 mg/dL (ref <130)
Total CHOL/HDL Ratio: 2.4 (calc) (ref <5.0)
Triglycerides: 103 mg/dL (ref <150)

## 2024-06-22 ENCOUNTER — Encounter: Payer: Self-pay | Admitting: "Endocrinology

## 2024-06-22 ENCOUNTER — Ambulatory Visit (INDEPENDENT_AMBULATORY_CARE_PROVIDER_SITE_OTHER): Admitting: "Endocrinology

## 2024-06-22 VITALS — BP 130/80 | HR 78 | Ht 69.0 in | Wt 204.0 lb

## 2024-06-22 DIAGNOSIS — E7800 Pure hypercholesterolemia, unspecified: Secondary | ICD-10-CM

## 2024-06-22 DIAGNOSIS — E1165 Type 2 diabetes mellitus with hyperglycemia: Secondary | ICD-10-CM | POA: Diagnosis not present

## 2024-06-22 DIAGNOSIS — Z7984 Long term (current) use of oral hypoglycemic drugs: Secondary | ICD-10-CM | POA: Diagnosis not present

## 2024-06-22 DIAGNOSIS — Z794 Long term (current) use of insulin: Secondary | ICD-10-CM | POA: Diagnosis not present

## 2024-06-22 LAB — POCT GLYCOSYLATED HEMOGLOBIN (HGB A1C): Hemoglobin A1C: 7.1 % — AB (ref 4.0–5.6)

## 2024-06-22 NOTE — Patient Instructions (Signed)

## 2024-06-22 NOTE — Progress Notes (Signed)
 Outpatient Endocrinology Note Obadiah Birmingham, MD  06/22/24   Rachel Bishop [birth year] [id number]  Referring Provider: Marvine Rush, MD Primary Care Provider: Marvine Rush, MD Reason for consultation: Subjective   Assessment & Plan  Diagnoses and all orders for this visit:  Uncontrolled type 2 diabetes mellitus with hyperglycemia, with long-term current use of insulin (HCC) -     POCT glycosylated hemoglobin (Hb A1C)  Long term (current) use of oral hypoglycemic drugs  Pure hypercholesterolemia   Diabetes Type II complicated by neuropathy Lab Results  Component Value Date   GFR 94.80 06/02/2023   Hba1c goal less than 7, current Hba1c is  Lab Results  Component Value Date   HGBA1C 7.1 (A) 06/22/2024   Will recommend the following: Synjardy  12.02/999 mg bid  Stop Glimepiride  2 mg every day  Stop Metformin  1000 mg bid Stopped Ozempic  0.25 mg weekly due to abdominal pain and diarrhea  Not interested in GLP1   Stop Janumet Patient is not interested in diabetes education at this time   No known contraindications/side effects to any of above medications No history of MEN syndrome/medullary thyroid cancer/pancreatitis or pancreatic cancer in self or family  -Last LD and Tg are as follows: Lab Results  Component Value Date   LDLCALC 57 06/18/2024    Lab Results  Component Value Date   TRIG 103 06/18/2024   -not on statin -11/12/23: Started atorvastatin  10 mg qhs -Follow low fat diet and exercise  -Will start statin once diabetes medications are settled to avoid polypharmacy at the same time   -Blood pressure goal <140/90 - Microalbumin/creatinine goal is < 30 -Last MA/Cr is as follows: Lab Results  Component Value Date   MICROALBUR 0.4 06/18/2024   -not on ACE/ARB  -diet changes including salt restriction -limit eating outside -counseled BP targets per standards of diabetes care -uncontrolled blood pressure can lead to retinopathy, nephropathy  and cardiovascular and atherosclerotic heart disease  Reviewed and counseled on: -A1C target -Blood sugar targets -Complications of uncontrolled diabetes  -Checking blood sugar before meals and bedtime and bring log next visit -All medications with mechanism of action and side effects -Hypoglycemia management: rule of 15's, Glucagon Emergency Kit and medical alert ID -low-carb low-fat plate-method diet -At least 20 minutes of physical activity per day -Annual dilated retinal eye exam and foot exam -compliance and follow up needs -follow up as scheduled or earlier if problem gets worse  Call if blood sugar is less than 70 or consistently above 250    Take a 15 gm snack of carbohydrate at bedtime before you go to sleep if your blood sugar is less than 100.    If you are going to fast after midnight for a test or procedure, ask your physician for instructions on how to reduce/decrease your insulin dose.    Call if blood sugar is less than 70 or consistently above 250  -Treating a low sugar by rule of 15  (15 gms of sugar every 15 min until sugar is more than 70) If you feel your sugar is low, test your sugar to be sure If your sugar is low (less than 70), then take 15 grams of a fast acting Carbohydrate (3-4 glucose tablets or glucose gel or 4 ounces of juice or regular soda) Recheck your sugar 15 min after treating low to make sure it is more than 70 If sugar is still less than 70, treat again with 15 grams of carbohydrate  Don't drive the hour of hypoglycemia  If unconscious/unable to eat or drink by mouth, use glucagon injection or nasal spray baqsimi and call 911. Can repeat again in 15 min if still unconscious.  Return in about 3 months (around 09/21/2024).   I have reviewed current medications, nurse's notes, allergies, vital signs, past medical and surgical history, family medical history, and social history for this encounter. Counseled patient on symptoms, examination  findings, lab findings, imaging results, treatment decisions and monitoring and prognosis. The patient understood the recommendations and agrees with the treatment plan. All questions regarding treatment plan were fully answered.  Obadiah Birmingham, MD  06/22/24  History of Present Illness Rachel Bishop is a 58 y.o. year old female who presents for follow up of Type II diabetes mellitus.  Rachel Bishop was first diagnosed in 2022.   Diabetes education +  Home diabetes regimen: Synjardy  12.5/1000mg  twice daily  Previously on, Glimepiride  2 mg qam Metformin  1000 mg bid   COMPLICATIONS -  MI/Stroke -  retinopathy +  neuropathy -  nephropathy  BLOOD SUGAR DATA CGM interpretation: At today's visit, we reviewed her CGM downloads. The full report is scanned in the media. Reviewing the CGM trends, BG are well controlled, with some highs in daytime after each meal.  Physical Exam  BP 130/80   Pulse 78   Ht 5' 9 (1.753 m)   Wt 204 lb (92.5 kg)   SpO2 96%   BMI 30.13 kg/m    Constitutional: well developed, well nourished Head: normocephalic, atraumatic Eyes: sclera anicteric, no redness Neck: supple Lungs: normal respiratory effort Neurology: alert and oriented Skin: dry, no appreciable rashes Musculoskeletal: no appreciable defects Psychiatric: normal mood and affect Diabetic Foot Exam - Simple   No data filed      Current Medications Patient's Medications  New Prescriptions   No medications on file  Previous Medications   ATORVASTATIN  (LIPITOR) 10 MG TABLET    Take 1 tablet (10 mg total) by mouth daily.   CHOLECALCIFEROL (VITAMIN D3) 25 MCG CAPS    daily.   CONTINUOUS GLUCOSE SENSOR (FREESTYLE LIBRE 3 PLUS SENSOR) MISC    Inject 1 Device into the skin continuous. Change every 15 days   FLUTICASONE  (FLONASE ) 50 MCG/ACT NASAL SPRAY    Place 2 sprays into both nostrils daily.   MAGNESIUM ASPARTATE PO    daily.   MULTIPLE VITAMIN (MULTIVITAMIN WITH MINERALS) TABS  TABLET    Take 1 tablet by mouth daily.   OMEGA-3 FATTY ACIDS (FISH OIL) 1000 MG CAPS    Take by mouth.   ONDANSETRON  (ZOFRAN ) 4 MG TABLET    Take 1 tablet (4 mg total) by mouth as directed. Take one Zofran  4 mg tablet 30-60 minutes before each colonoscopy prep dose   PHENAZOPYRIDINE  (PYRIDIUM ) 100 MG TABLET    Take 1 tablet (100 mg total) by mouth 3 (three) times daily as needed for pain.   SYNJARDY  12.02-999 MG TABS    TAKE 1 TABLET BY MOUTH TWICE DAILY   TAMSULOSIN  (FLOMAX ) 0.4 MG CAPS CAPSULE    Take 1 capsule (0.4 mg total) by mouth daily after supper.  Modified Medications   No medications on file  Discontinued Medications   No medications on file    Allergies No Known Allergies  Past Medical History Past Medical History:  Diagnosis Date   BCC (basal cell carcinoma of skin) 05/06/2017   bcc sup right lower shin tx cx3 33fu   Hyperlipidemia  Type 2 diabetes mellitus (HCC)     Past Surgical History Past Surgical History:  Procedure Laterality Date   CHOLECYSTECTOMY      Family History family history includes Bladder Cancer in her maternal grandmother.  Social History Social History   Socioeconomic History   Marital status: Married    Spouse name: Not on file   Number of children: Not on file   Years of education: Not on file   Highest education level: Not on file  Occupational History   Not on file  Tobacco Use   Smoking status: Never   Smokeless tobacco: Never  Vaping Use   Vaping status: Not on file  Substance and Sexual Activity   Alcohol use: Never   Drug use: Never   Sexual activity: Not on file  Other Topics Concern   Not on file  Social History Narrative   Not on file   Social Drivers of Health   Financial Resource Strain: Not on file  Food Insecurity: Not on file  Transportation Needs: Not on file  Physical Activity: Not on file  Stress: Not on file  Social Connections: Not on file  Intimate Partner Violence: Not on file    Lab Results   Component Value Date   HGBA1C 7.1 (A) 06/22/2024   HGBA1C 6.9 (A) 03/17/2024   HGBA1C 6.0 (A) 11/12/2023   Lab Results  Component Value Date   CHOL 131 06/18/2024   Lab Results  Component Value Date   HDL 55 06/18/2024   Lab Results  Component Value Date   LDLCALC 57 06/18/2024   Lab Results  Component Value Date   TRIG 103 06/18/2024   Lab Results  Component Value Date   CHOLHDL 2.4 06/18/2024   Lab Results  Component Value Date   CREATININE 0.57 06/18/2024   Lab Results  Component Value Date   GFR 94.80 06/02/2023   Lab Results  Component Value Date   MICROALBUR 0.4 06/18/2024      Component Value Date/Time   NA 138 06/18/2024 0805   K 4.0 06/18/2024 0805   CL 103 06/18/2024 0805   CO2 28 06/18/2024 0805   GLUCOSE 154 (H) 06/18/2024 0805   BUN 17 06/18/2024 0805   CREATININE 0.57 06/18/2024 0805   CALCIUM  9.6 06/18/2024 0805   PROT 7.2 06/18/2024 0805   ALBUMIN 4.2 09/24/2023 0533   AST 12 06/18/2024 0805   ALT 16 06/18/2024 0805   ALKPHOS 49 09/24/2023 0533   BILITOT 0.4 06/18/2024 0805   GFRNONAA >60 09/24/2023 0533      Latest Ref Rng & Units 06/18/2024    8:05 AM 09/24/2023    5:33 AM 06/02/2023   10:53 AM  BMP  Glucose 65 - 99 mg/dL 845  863  882   BUN 7 - 25 mg/dL 17  16  18    Creatinine 0.50 - 1.05 mg/dL 9.42  9.33  9.29   BUN/Creat Ratio 6 - 22 (calc) SEE NOTE:     Sodium 135 - 146 mmol/L 138  139  135   Potassium 3.5 - 5.3 mmol/L 4.0  3.9  4.2   Chloride 98 - 110 mmol/L 103  104  102   CO2 20 - 32 mmol/L 28  24  26    Calcium  8.6 - 10.4 mg/dL 9.6  9.8  9.6        Component Value Date/Time   WBC 11.5 (H) 09/24/2023 0533   RBC 4.41 09/24/2023 0533   HGB 13.8 09/24/2023  0533   HCT 40.8 09/24/2023 0533   PLT 260 09/24/2023 0533   MCV 92.5 09/24/2023 0533   MCH 31.3 09/24/2023 0533   MCHC 33.8 09/24/2023 0533   RDW 12.4 09/24/2023 0533   LYMPHSABS 2.2 09/24/2023 0533   MONOABS 1.0 09/24/2023 0533   EOSABS 0.2 09/24/2023 0533    BASOSABS 0.1 09/24/2023 0533     Parts of this note may have been dictated using voice recognition software. There may be variances in spelling and vocabulary which are unintentional. Not all errors are proofread. Please notify the dino if any discrepancies are noted or if the meaning of any statement is not clear.

## 2024-06-24 ENCOUNTER — Encounter: Payer: Self-pay | Admitting: "Endocrinology

## 2024-07-27 DIAGNOSIS — Z13820 Encounter for screening for osteoporosis: Secondary | ICD-10-CM | POA: Diagnosis not present

## 2024-09-07 DIAGNOSIS — E119 Type 2 diabetes mellitus without complications: Secondary | ICD-10-CM | POA: Diagnosis not present

## 2024-09-07 DIAGNOSIS — E559 Vitamin D deficiency, unspecified: Secondary | ICD-10-CM | POA: Diagnosis not present

## 2024-09-07 DIAGNOSIS — E785 Hyperlipidemia, unspecified: Secondary | ICD-10-CM | POA: Diagnosis not present

## 2024-09-07 DIAGNOSIS — R300 Dysuria: Secondary | ICD-10-CM | POA: Diagnosis not present

## 2024-09-21 ENCOUNTER — Ambulatory Visit: Admitting: "Endocrinology

## 2024-10-28 ENCOUNTER — Encounter: Payer: Self-pay | Admitting: Emergency Medicine

## 2024-10-28 ENCOUNTER — Ambulatory Visit: Admission: EM | Admit: 2024-10-28 | Discharge: 2024-10-28 | Disposition: A | Discharge status: Home / Self Care

## 2024-10-28 DIAGNOSIS — M546 Pain in thoracic spine: Secondary | ICD-10-CM | POA: Diagnosis not present

## 2024-10-28 DIAGNOSIS — Z8739 Personal history of other diseases of the musculoskeletal system and connective tissue: Secondary | ICD-10-CM

## 2024-10-28 DIAGNOSIS — M62830 Muscle spasm of back: Secondary | ICD-10-CM | POA: Diagnosis not present

## 2024-10-28 MED ORDER — KETOROLAC TROMETHAMINE 30 MG/ML IJ SOLN
30.0000 mg | Freq: Once | INTRAMUSCULAR | Status: AC
  Administered 2024-10-28: 30 mg via INTRAMUSCULAR

## 2024-10-28 MED ORDER — METHOCARBAMOL 500 MG PO TABS: 500.0000 mg | ORAL_TABLET | Freq: Three times a day (TID) | ORAL | 0 refills | Status: AC | PRN

## 2024-10-28 MED ORDER — DEXAMETHASONE SOD PHOSPHATE PF 10 MG/ML IJ SOLN
10.0000 mg | Freq: Once | INTRAMUSCULAR | Status: AC
  Administered 2024-10-28: 10 mg via INTRAMUSCULAR

## 2024-10-28 NOTE — Discharge Instructions (Addendum)
 You were given an injection of Toradol  30 mg and Decadron  10 mg.  Do not take any additional NSAIDs today to include ibuprofen, Aleve, Motrin, naproxen, or Advil.  You may take Tylenol  for breakthrough pain or discomfort. Take medication as prescribed. Recommend the use of ice or heat.  Apply ice for pain or swelling, heat for spasm or stiffness.  Apply for 20 minutes, remove for 1 hour, repeat as needed. Gentle stretching and range of motion exercises while symptoms persist. Try to remain as active as possible while symptoms persist. Go to the emergency department if you experience sudden loss of bowel or bladder function, become unable to walk, have weakness in your legs or feet, or other concerns. I have replaced the referral for you to follow-up with orthopedics.  Please call their office to schedule an appointment. Follow-up as needed.

## 2024-10-28 NOTE — ED Provider Notes (Signed)
 " [facility]    CSN: [id number] Arrival date & time: 10/28/24  1512      History   Chief Complaint No chief complaint on file.   HPI Rachel Bishop is a 59 y.o. female.   The history is provided by the patient.   Patient presents with a 1 week history of muscle spasms to the right mid back.  She denies any specific injury or trauma.  She reports a 10-year history of dealing with the same or similar symptoms.  States that her last episode was around Christmas, but those symptoms improved.  She states since the symptoms started, they have appeared to worsen.  States that she did get a prescription for cyclobenzaprine with minimal relief.  Also states that she took her husband's prescription Toradol  with minimal relief.  She also states that she has been wearing a TENS unit to provide some relief.  She denies numbness, tingling, radiation of pain, loss of bowel or bladder function, upper extremity weakness, lower extremity weakness, or saddle anesthesia.  Patient states that she has had x-rays done of her back in the past, and that she has seen her PCP.  Reports she has never had an MRI or seen a specialist for her symptoms.  Patient with history of diabetes, reports her most recent A1c was around 7.  Past Medical History:  Diagnosis Date   BCC (basal cell carcinoma of skin) 05/06/2017   bcc sup right lower shin tx cx3 4fu   Hyperlipidemia    Type 2 diabetes mellitus (HCC)     There are no active problems to display for this patient.   Past Surgical History:  Procedure Laterality Date   CHOLECYSTECTOMY      OB History   No obstetric history on file.      Home Medications    Prior to Admission medications  Medication Sig Start Date End Date Taking? Authorizing Provider  cyclobenzaprine (FLEXERIL) 5 MG tablet Take 5 mg by mouth 3 (three) times daily as needed for muscle spasms.   Yes [provider]  methocarbamol  (ROBAXIN ) 500 MG tablet Take 1 tablet  (500 mg total) by mouth every 8 (eight) hours as needed for muscle spasms. 10/28/24  Yes Leath-Warren, Etta PARAS, NP  atorvastatin  (LIPITOR) 10 MG tablet Take 1 tablet (10 mg total) by mouth daily. 11/12/23   Motwani, Komal, MD  Cholecalciferol (VITAMIN D3) 25 MCG CAPS daily.    [provider]  Continuous Glucose Sensor (FREESTYLE LIBRE 3 PLUS SENSOR) MISC Inject 1 Device into the skin continuous. Change every 15 days 08/22/23   Dartha Ernst, MD  fluticasone  (FLONASE ) 50 MCG/ACT nasal spray Place 2 sprays into both nostrils daily. 04/24/24   Leath-Warren, Etta PARAS, NP  MAGNESIUM ASPARTATE PO daily.    [provider]  Multiple Vitamin (MULTIVITAMIN WITH MINERALS) TABS tablet Take 1 tablet by mouth daily.    [provider]  Omega-3 Fatty Acids (FISH OIL) 1000 MG CAPS Take by mouth.    [provider]  ondansetron  (ZOFRAN ) 4 MG tablet Take 1 tablet (4 mg total) by mouth as directed. Take one Zofran  4 mg tablet 30-60 minutes before each colonoscopy prep dose 01/13/24   Stacia Glendia BRAVO, MD  phenazopyridine  (PYRIDIUM ) 100 MG tablet Take 1 tablet (100 mg total) by mouth 3 (three) times daily as needed for pain. 04/24/24   Leath-Warren, Etta PARAS, NP  SYNJARDY  12.02-999 MG TABS TAKE 1 TABLET BY MOUTH TWICE DAILY 06/15/24  Motwani, Komal, MD  tamsulosin  (FLOMAX ) 0.4 MG CAPS capsule Take 1 capsule (0.4 mg total) by mouth daily after supper. 04/24/24   Leath-Warren, Etta PARAS, NP    Family History Family History  Problem Relation Age of Onset   Bladder Cancer Maternal Grandmother    Colon cancer Neg Hx    Colon polyps Neg Hx    Esophageal cancer Neg Hx    Rectal cancer Neg Hx    Stomach cancer Neg Hx     Social History Social History[1]   Allergies   Patient has no known allergies.   Review of Systems Review of Systems Per HPI  Physical Exam Triage Vital Signs ED Triage Vitals  Encounter Vitals Group     BP 10/28/24 1515 125/78     Girls Systolic  BP Percentile --      Girls Diastolic BP Percentile --      Boys Systolic BP Percentile --      Boys Diastolic BP Percentile --      Pulse Rate 10/28/24 1515 (!) 106     Resp 10/28/24 1515 18     Temp 10/28/24 1515 98.6 F (37 C)     Temp Source 10/28/24 1515 Oral     SpO2 10/28/24 1515 94 %     Weight --      Height --      Head Circumference --      Peak Flow --      Pain Score 10/28/24 1516 8     Pain Loc --      Pain Education --      Exclude from Growth Chart --    No data found.  Updated Vital Signs BP 125/78 (BP Location: Right Arm)   Pulse (!) 106   Temp 98.6 F (37 C) (Oral)   Resp 18   SpO2 94%   Visual Acuity Right Eye Distance:   Left Eye Distance:   Bilateral Distance:    Right Eye Near:   Left Eye Near:    Bilateral Near:     Physical Exam Vitals and nursing note reviewed.  Constitutional:      General: She is not in acute distress.    Appearance: Normal appearance.  HENT:     Head: Normocephalic.  Eyes:     Extraocular Movements: Extraocular movements intact.     Pupils: Pupils are equal, round, and reactive to light.  Cardiovascular:     Rate and Rhythm: Normal rate and regular rhythm.     Pulses: Normal pulses.     Heart sounds: Normal heart sounds.  Pulmonary:     Effort: Pulmonary effort is normal.     Breath sounds: Normal breath sounds.  Abdominal:     General: Bowel sounds are normal.     Palpations: Abdomen is soft.  Musculoskeletal:     Cervical back: Normal range of motion.     Thoracic back: Spasms (Right thoracic spine) present. No swelling or deformity. Decreased range of motion.       Back:     Comments: Spasm noted to the right thoracic spine.  There is no obvious deformity, erythema, or bruising present.  Skin:    General: Skin is warm and dry.  Neurological:     General: No focal deficit present.     Mental Status: She is alert and oriented to person, place, and time.  Psychiatric:        Mood and Affect: Mood  normal.  Behavior: Behavior normal.      UC Treatments / Results  Labs (all labs ordered are listed, but only abnormal results are displayed) Labs Reviewed - No data to display  EKG   Radiology No results found.  Procedures Procedures (including critical care time)  Medications Ordered in UC Medications  ketorolac  (TORADOL ) 30 MG/ML injection 30 mg (30 mg Intramuscular Given 10/28/24 1536)  dexamethasone  (DECADRON ) injection 10 mg (10 mg Intramuscular Given 10/28/24 1535)    Initial Impression / Assessment and Plan / UC Course  I have reviewed the triage vital signs and the nursing notes.  Pertinent labs & imaging results that were available during my care of the patient were reviewed by me and considered in my medical decision making (see chart for details).  On exam, patient with spasm and tenderness to the right mid back.  There is no obvious bruising, deformity, or injury present.  She does report a prior history of back spasms.  States she has been taking a prescription of cyclobenzaprine with minimal relief.  Patient with history of diabetes, most recent A1c was around 7, per review of the chart, most GFR was 104 (06/18/2024)..  Treat with Decadron  10 mg IM and Toradol  30 mg IM.  Will start patient on Robaxin  500 mg for spasm and pain.  Supportive care recommendations were provided and discussed with the patient to include the use of ice or heat, stretching exercises, and remaining active.  Patient was also given strict ER follow-up precautions.  Referral was placed for orthopedics for patient's ongoing symptoms, patient was advised she will need to call and follow-up for an appointment.  Patient was in agreement with this plan of care and verbalizes understanding.  All questions were answered.  Patient stable for discharge.   Final Clinical Impressions(s) / UC Diagnoses   Final diagnoses:  Right-sided thoracic back pain, unspecified chronicity  Spasm, muscle, back   History of back pain     Discharge Instructions      You were given an injection of Toradol  30 mg and Decadron  10 mg.  Do not take any additional NSAIDs today to include ibuprofen, Aleve, Motrin, naproxen, or Advil.  You may take Tylenol  for breakthrough pain or discomfort. Take medication as prescribed. Recommend the use of ice or heat.  Apply ice for pain or swelling, heat for spasm or stiffness.  Apply for 20 minutes, remove for 1 hour, repeat as needed. Gentle stretching and range of motion exercises while symptoms persist. Try to remain as active as possible while symptoms persist. Go to the emergency department if you experience sudden loss of bowel or bladder function, become unable to walk, have weakness in your legs or feet, or other concerns. I have replaced the referral for you to follow-up with orthopedics.  Please call their office to schedule an appointment. Follow-up as needed.     ED Prescriptions     Medication Sig Dispense Auth. Provider   methocarbamol  (ROBAXIN ) 500 MG tablet Take 1 tablet (500 mg total) by mouth every 8 (eight) hours as needed for muscle spasms. 30 tablet Leath-Warren, Etta PARAS, NP      PDMP not reviewed this encounter.    [1]  Social History Tobacco Use   Smoking status: Never   Smokeless tobacco: Never  Substance Use Topics   Alcohol use: Never   Drug use: Never     Gilmer Etta PARAS, NP 10/28/24 1605  "

## 2024-10-28 NOTE — ED Triage Notes (Signed)
 Mid right sided muscle spasms x 1 week.  Has been taking cyclobenzaprine without relief.

## 2024-11-16 ENCOUNTER — Encounter: Payer: Self-pay | Admitting: Orthopedic Surgery

## 2024-11-16 ENCOUNTER — Ambulatory Visit: Admitting: Orthopedic Surgery

## 2024-11-16 VITALS — BP 120/79 | HR 96 | Ht 69.0 in | Wt 204.0 lb

## 2024-11-16 DIAGNOSIS — M62830 Muscle spasm of back: Secondary | ICD-10-CM

## 2024-11-16 DIAGNOSIS — M7061 Trochanteric bursitis, right hip: Secondary | ICD-10-CM

## 2024-11-16 NOTE — Patient Instructions (Signed)

## 2024-11-16 NOTE — Progress Notes (Signed)
 New Patient Visit  Summary: Rachel Bishop is a 59 y.o. female with the following: Back spasm  Assessment and Plan Assessment & Plan Chronic back muscle spasm 12-year history of severe paraspinal muscle spasms with increasing frequency and migratory pain, likely muscular etiology related to occupational factors and postural fatigue. Imaging shows no significant arthritis or bony pathology. Previous treatments provided limited relief. Underlying weakness and fatigue are contributing factors. - Initiated physical therapy for strengthening and stretching of postural muscles - Discussed adjunctive yoga with gradual progression. - Reinforced continued use of NSAIDs for symptom management. - Reviewed use of TENS unit for relief. - Arranged physical therapy at a convenient location.  Right greater trochanteric bursitis Previously treated with corticosteroid injection, providing temporary relief. MRI may be indicated if symptoms worsen or become refractory. - Discussed corticosteroid injections for relief, noting variable duration of benefit. - Reviewed potential use of MRI if symptoms worsen or become refractory.     Follow-up: Return if symptoms worsen or fail to improve.  Subjective:  Chief Complaint  Patient presents with   Back Pain    Different places in the lower back for approx 12 yrs. No hx of treatment from neurosurgery. No injections, but has had different medications from UC to include oxycodone , naprosyn, tylenol , and aleve. States only the oxycodone  has helped. No currently having pain.      Discussed the use of AI scribe software for clinical note transcription with the patient, who gave verbal consent to proceed.  History of Present Illness Rachel Bishop is a 59 year old female with type 2 diabetes who presents for evaluation of chronic, recurrent back muscle spasms.  She has had severe intermittent paraspinal back spasms for about 12 years, increasing over the past  2 years from twice yearly to 3-4 times per year. Pain is sharp and rhythmic, like a knife, in varying paraspinal locations and never midline. The most recent episode started 2 weeks ago and resolved about 1.5 weeks ago. 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 Episodes disrupt sleep and she changes positions frequently and uses a heating pad. She denies leg radiation, numbness, or tingling. She notes intermittent neck tightness, especially while driving, and upper back muscle tightness for the past 4 days. She has occasional mild numbness in her feet, which she attributes to neuropathy.  She has tried ibuprofen and naproxen without meaningful benefit. A prior urgent care visit included a steroid injection and an unknown muscle relaxant with minimal effect. She has not done physical therapy, chiropractic care, or yoga, and dislikes massage therapy. Prior imaging includes spine x-rays and a CT scan in 2024.  She has bilateral hip bursitis previously treated with a corticosteroid injection that relieved pain for about 1 month before symptoms returned to baseline. She has not had hip MRI. 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  Currently, she is not complaining of significant pain in either hip.    Review of Systems: No fevers or chills No numbness or tingling No chest pain No shortness of breath No bowel or bladder dysfunction No GI distress No headaches   Medical History:  Past Medical History:  Diagnosis Date   BCC (basal cell carcinoma of skin) 05/06/2017   bcc sup right lower shin tx cx3 55fu   Hyperlipidemia    Type 2 diabetes mellitus (HCC)  Past Surgical History:  Procedure Laterality Date   CHOLECYSTECTOMY      Family History  Problem Relation Age of Onset   Bladder Cancer Maternal Grandmother    Colon cancer Neg  Hx    Colon polyps Neg Hx    Esophageal cancer Neg Hx    Rectal cancer Neg Hx    Stomach cancer Neg Hx    Social History[1]  Allergies[2]  Active Medications[3]  Objective: BP 120/79   Pulse 96   Ht 5' 9 (1.753 m)   Wt 204 lb (92.5 kg)   BMI 30.13 kg/m   Physical Exam:    General: Alert and oriented. and No acute distress. Gait: Normal gait.  Physical Exam NEUROLOGICAL: Motor strength normal in upper and lower extremities.  Mild tenderness to palpation lateral to the midline, within the musculature on the right.  Negative straight leg raise bilaterally.  2+ patellar tendon reflex bilaterally.  Sensation intact throughout bilateral lower extremities.  She is able to bend over and touch her toes, with some reservation.  No problems getting out of her chair.   IMAGING: No new imaging obtained today   Reviewed a CT of her abdomen and pelvis from December 2024, no evidence of anterolisthesis.  Maintained disc height, with mild degenerative changes.  New Medications:  No orders of the defined types were placed in this encounter.     Portions of this note were completed via Scientist, clinical (histocompatibility and immunogenetics).  Oneil DELENA Horde, MD  11/16/2024 11:05 AM      [1]  Social History Tobacco Use   Smoking status: Never   Smokeless tobacco: Never  Substance Use Topics   Alcohol use: Never   Drug use: Never  [2] No Known Allergies [3]  Current Meds  Medication Sig   atorvastatin  (LIPITOR) 10 MG tablet Take 1 tablet (10 mg total) by mouth daily.   Cholecalciferol (VITAMIN D3) 25 MCG CAPS daily.   Continuous Glucose Sensor (FREESTYLE LIBRE 3 PLUS SENSOR) MISC Inject 1 Device into the skin continuous. Change every 15 days   cyclobenzaprine (FLEXERIL) 5 MG tablet Take 5 mg by mouth 3 (three) times daily as needed for muscle spasms.   fluticasone  (FLONASE ) 50 MCG/ACT nasal spray Place 2 sprays into both nostrils daily.   MAGNESIUM ASPARTATE PO daily.   methocarbamol  (ROBAXIN ) 500  MG tablet Take 1 tablet (500 mg total) by mouth every 8 (eight) hours as needed for muscle spasms.   Multiple Vitamin (MULTIVITAMIN WITH MINERALS) TABS tablet Take 1 tablet by mouth daily.   Omega-3 Fatty Acids (FISH OIL) 1000 MG CAPS Take by mouth.   ondansetron  (ZOFRAN ) 4 MG tablet Take 1 tablet (4 mg total) by mouth as directed. Take one Zofran  4 mg tablet 30-60 minutes before each colonoscopy prep dose   phenazopyridine  (PYRIDIUM ) 100 MG tablet Take 1 tablet (100 mg total) by mouth 3 (three) times daily as needed for pain.   SYNJARDY  12.02-999 MG TABS TAKE 1 TABLET BY MOUTH TWICE DAILY   tamsulosin  (FLOMAX ) 0.4 MG CAPS capsule Take 1 capsule (0.4 mg total) by mouth daily after supper.
